# Patient Record
Sex: Female | Born: 1945
Health system: Southern US, Community
[De-identification: ages and names within clinical notes are randomized; demographics above are authoritative.]

## PROBLEM LIST (undated history)

## (undated) DIAGNOSIS — T8859XA Other complications of anesthesia, initial encounter: Secondary | ICD-10-CM

## (undated) DIAGNOSIS — Z9889 Other specified postprocedural states: Secondary | ICD-10-CM

## (undated) DIAGNOSIS — I499 Cardiac arrhythmia, unspecified: Secondary | ICD-10-CM

## (undated) DIAGNOSIS — T4145XA Adverse effect of unspecified anesthetic, initial encounter: Secondary | ICD-10-CM

## (undated) DIAGNOSIS — R112 Nausea with vomiting, unspecified: Secondary | ICD-10-CM

---

## 2001-12-11 HISTORY — PX: FINGER SURGERY: SHX640

## 2005-09-26 ENCOUNTER — Ambulatory Visit: Payer: Self-pay | Admitting: Internal Medicine

## 2006-10-02 ENCOUNTER — Ambulatory Visit: Payer: Self-pay | Admitting: Internal Medicine

## 2006-11-07 ENCOUNTER — Ambulatory Visit: Payer: Self-pay | Admitting: Orthopaedic Surgery

## 2006-11-20 ENCOUNTER — Ambulatory Visit: Payer: Self-pay | Admitting: Orthopaedic Surgery

## 2006-11-20 HISTORY — PX: ROTATOR CUFF REPAIR: SHX139

## 2008-02-26 ENCOUNTER — Ambulatory Visit: Payer: Self-pay | Admitting: Internal Medicine

## 2009-03-03 ENCOUNTER — Ambulatory Visit: Payer: Self-pay | Admitting: Internal Medicine

## 2010-03-07 ENCOUNTER — Ambulatory Visit: Payer: Self-pay | Admitting: Internal Medicine

## 2010-04-15 ENCOUNTER — Ambulatory Visit: Payer: Self-pay | Admitting: Unknown Physician Specialty

## 2011-03-09 ENCOUNTER — Ambulatory Visit: Payer: Self-pay | Admitting: Internal Medicine

## 2011-12-12 HISTORY — PX: KNEE ARTHROSCOPY: SUR90

## 2012-03-18 ENCOUNTER — Ambulatory Visit: Payer: Self-pay | Admitting: Internal Medicine

## 2012-06-19 ENCOUNTER — Other Ambulatory Visit: Payer: Self-pay | Admitting: Rheumatology

## 2012-06-19 LAB — SYNOVIAL CELL COUNT + DIFF, W/ CRYSTALS
Crystals, Joint Fluid: NONE SEEN
Lymphocytes: 47 %
Neutrophils: 23 %
Nucleated Cell Count: 287 /mm3
Other Cells BF: 0 %

## 2012-09-23 ENCOUNTER — Ambulatory Visit: Payer: Self-pay | Admitting: General Practice

## 2012-10-17 ENCOUNTER — Ambulatory Visit: Payer: Self-pay | Admitting: General Practice

## 2012-10-17 DIAGNOSIS — I10 Essential (primary) hypertension: Secondary | ICD-10-CM

## 2012-11-13 ENCOUNTER — Ambulatory Visit: Payer: Self-pay | Admitting: General Practice

## 2013-03-19 ENCOUNTER — Ambulatory Visit: Payer: Self-pay | Admitting: Internal Medicine

## 2014-03-20 ENCOUNTER — Ambulatory Visit: Payer: Self-pay | Admitting: Internal Medicine

## 2015-03-15 DIAGNOSIS — I10 Essential (primary) hypertension: Secondary | ICD-10-CM | POA: Insufficient documentation

## 2015-03-15 DIAGNOSIS — M1990 Unspecified osteoarthritis, unspecified site: Secondary | ICD-10-CM | POA: Insufficient documentation

## 2015-03-23 ENCOUNTER — Ambulatory Visit
Admit: 2015-03-23 | Disposition: A | Discharge status: Home / Self Care | Payer: Self-pay | Attending: Internal Medicine | Admitting: Internal Medicine

## 2015-03-30 NOTE — Op Note (Signed)
PATIENT NAME:  Becky Lam, Becky Lam MR#:  [id number] DATE OF BIRTH:  [birth year]  DATE OF PROCEDURE:  11/13/2012  PREOPERATIVE DIAGNOSIS: Internal derangement of the left knee.   POSTOPERATIVE DIAGNOSES:  1. Tear of the posterior horn medial masses, left knee.  2. Grade 3 chondromalacia involving the medial compartment.   PROCEDURES PERFORMED:  1. Left knee arthroscopy.  2. Partial medial meniscectomy.  3. Chondroplasty of the medial compartment.   SURGEON: Laurice Record. Holley Bouche., MD  ANESTHESIA: General.   ESTIMATED BLOOD LOSS: Minimal.   TOURNIQUET TIME: Not used.   DRAINS: None.   INDICATIONS FOR SURGERY: The patient is a 69 year old female who has been seen for complaints of persistent left knee pain. Patient localized most pain along the medial aspect of the knee and stated the pain was aggravated by pivoting or twisting-type activities. MRI demonstrated findings consistent with meniscal pathology. After discussion of the risks and benefits of surgical intervention, the patient expressed her understanding of the risks and benefits and agreed with plans for surgical intervention.   PROCEDURE IN DETAIL: Patient was brought into the Operating Room and, after adequate general anesthesia was achieved, a tourniquet was placed on the patient's left thigh and leg was placed in a leg holder. All bony prominences were well padded. Patient's left knee and leg were cleaned and prepped with alcohol and DuraPrep, draped in the usual sterile fashion. A "timeout" was performed as per usual protocol. The anticipated portal sites were injected with 0.25% Marcaine with epinephrine. An anterolateral portal was created and a cannula was inserted. A moderate effusion was evacuated. Scope was inserted and the knee was distended with fluid using the Stryker pump. Scope was advanced down the medial gutter into the medial compartment of the knee. Under visualization with the scope, an anteromedial portal was created and  hook probe was inserted. Inspection of the medial compartment demonstrated a complex tear of the posterior horn medial meniscus. There was a component of horizontal cleavage tear as well as flap-type lesions with bases both along the more medial aspect of the medial meniscus as well as along its lateral most attachment. The area of the tear was debrided using a combination of meniscal punches and a 4.5 mm shaver. Final contouring was performed using a 50 degrees ArthroCare wand, with care to carefully sculpt the transition to more meniscus. The posterior horn of the medial meniscus was then probed and felt to be stable. Anterior horn was visualized and probed and felt to be stable. There were changes of grade 3 chondromalacia involving the medial femoral condyle and portion of the medial tibial plateau. These areas were debrided using the 50 degrees ArthroCare wand. Scope was advanced into the intracondylar region and the anterior cruciate ligament was visualized and probed and felt to be stable. Scope was removed from the anterolateral portal and reinserted via the anteromedial portal so as to better visualize the lateral compartment. The articular surface of the lateral compartment was in excellent condition. The lateral meniscus was visualized and probed and felt to be stable. Finally, the scope was positioned so as to visualize the patellofemoral articulation. Good patellar tracking was noted. Articular surface was in good condition.   The knee was irrigated with copious amounts of fluid then suctioned dry. The anterolateral portal was reapproximated using 3-0 nylon. A combination of 0.25% Marcaine with epinephrine and 4 mg of morphine was injected via the scope. The scope was removed and the anteromedial portal was reapproximated using 3-0 nylon. Sterile  dressing was applied followed by application of ice wrap.   Patient tolerated the procedure well. She was transported to the recovery room in stable  condition.    ____________________________ Laurice Record. Holley Bouche., MD jph:cms D: 11/14/2012 02:36:13 ET T: 11/14/2012 10:04:09 ET JOB#: [id number]  cc: Jeneen Rinks P. Holley Bouche., MD, <Dictator>  Laurice Record Holley Bouche MD ELECTRONICALLY SIGNED 11/17/2012 17:36

## 2015-03-31 ENCOUNTER — Ambulatory Visit
Admit: 2015-03-31 | Disposition: A | Discharge status: Home / Self Care | Payer: Self-pay | Attending: Unknown Physician Specialty | Admitting: Unknown Physician Specialty

## 2015-03-31 HISTORY — PX: COLONOSCOPY: SHX174

## 2016-01-12 ENCOUNTER — Encounter: Payer: Self-pay | Admitting: *Deleted

## 2016-01-19 ENCOUNTER — Ambulatory Visit (INDEPENDENT_AMBULATORY_CARE_PROVIDER_SITE_OTHER): Payer: PPO | Admitting: General Surgery

## 2016-01-19 ENCOUNTER — Other Ambulatory Visit: Payer: Self-pay

## 2016-01-19 ENCOUNTER — Encounter: Payer: Self-pay | Admitting: General Surgery

## 2016-01-19 VITALS — BP 132/74 | HR 72 | Resp 14 | Ht 70.0 in | Wt 179.0 lb

## 2016-01-19 DIAGNOSIS — N644 Mastodynia: Secondary | ICD-10-CM | POA: Diagnosis not present

## 2016-01-19 NOTE — Patient Instructions (Signed)
Patient may take aleve for the next two weeks twice daily  Continue self breast exams. Call office for any new breast issues or concerns.  Call office in two weeks to see how things are going.

## 2016-01-19 NOTE — Progress Notes (Signed)
Patient ID: Becky Lam, female   DOB: Dec 17, 1945, 70 y.o.   MRN: [id number]  Chief Complaint  Patient presents with  . Other    left breast pain    HPI Becky Lam is a 70 y.o. female here today for evaluation of left breast pain. She states she has been having left breast pain in her left breast for three weeks now. Pain/burn is always there. The patient has sporadically taken Aleve with relief of her discomfort. There is no history of trauma to the area. No unusual physical activity.  Last mammogram was in April,2016. No family history of breast cancer.  Get regular mammograms but doesn't perform self breast checks.   I personally reviewed the patient's history. HPI  No past medical history on file.  Past Surgical History  Procedure Laterality Date  . Colonoscopy  03/31/15  . Rotator cuff repair Left 11/20/2006  . Finger surgery Right 2003    middle   . Knee arthroscopy Left 2013    Family History  Problem Relation Age of Onset  . Cancer Neg Hx     Social History Social History  Substance Use Topics  . Smoking status: Never Smoker   . Smokeless tobacco: None  . Alcohol Use: No    No Known Allergies  Current Outpatient Prescriptions  Medication Sig Dispense Refill  . aspirin 81 MG tablet Take 81 mg by mouth daily.    . hydrochlorothiazide (HYDRODIURIL) 25 MG tablet TK 1 T PO QD  1  . latanoprost (XALATAN) 0.005 % ophthalmic solution INT 1 GTT IN OU QD  6  . loratadine (ALLERGY) 10 MG tablet Take 10 mg by mouth daily.    . Misc Natural Products (CALCIUM PLUS ADVANCED PO) Take by mouth 2 (two) times daily.    . Misc Natural Products (EQL GREEN TEA COMPLEX) TABS Take by mouth daily.    . Multiple Vitamins-Minerals (MULTIVITAMIN WITH MINERALS) tablet Take 1 tablet by mouth daily.    . Omega-3 Fatty Acids (FISH OIL) 1000 MG CAPS Take by mouth daily.    . phentermine 15 MG capsule TK ONE C PO ONCE D  3  . Vitamin D, Cholecalciferol, 1000 units TABS Take by mouth daily.      No current facility-administered medications for this visit.    Review of Systems Review of Systems  Constitutional: Negative.   Respiratory: Negative.   Cardiovascular: Negative.     Blood pressure 132/74, pulse 72, resp. rate 14, height 5\' 10"  (1.778 m), weight 179 lb (81.194 kg).  Physical Exam Physical Exam  Constitutional: She is oriented to person, place, and time. She appears well-developed and well-nourished.  Eyes: Conjunctivae are normal. No scleral icterus.  Neck: Neck supple.  Cardiovascular: Normal rate, regular rhythm and normal heart sounds.   Pulmonary/Chest: Effort normal and breath sounds normal. Right breast exhibits no inverted nipple, no mass, no nipple discharge, no skin change and no tenderness. Left breast exhibits tenderness (left breast tenderness at 2o'clock). Left breast exhibits no inverted nipple, no mass, no nipple discharge and no skin change.  Abdominal: Soft. Bowel sounds are normal.  Lymphadenopathy:    She has no cervical adenopathy.    She has no axillary adenopathy.  Neurological: She is alert and oriented to person, place, and time.  Skin: Skin is warm and dry.    Data Reviewed [place] mammograms dated 03/23/2015 completed [facility] were reviewed. BI-RADS-1.  Ultrasound examination of the upper-outer quadrant of the left breast was completed.  No discernible abnormality was appreciated within the breast parenchyma. No cyst or solid lesions. No evidence of architectural distortion. A single small normal-appearing lymph node in the lower aspect of the axilla measuring 0.63 x 0.64 x 1.08 cm with normal echo architecture is appreciated. BI-RADS-1.  Assessment    Mild breast tenderness without clear etiology. No clear musculoskeletal source on exam.    Plan    She has been encouraged to make use of Aleve, 2 tablets to start and one tablet twice a day to see if this will resolve the baseline inflammation. This patient was instructed to call the  office with a progress report in 2 weeks.      Patient to return as needed.    PCP:  Sabra Heck,  This information has been scribed by Gaspar Cola CMA.   Robert Bellow 01/20/2016, 7:50 AM

## 2016-01-20 DIAGNOSIS — M2391 Unspecified internal derangement of right knee: Secondary | ICD-10-CM | POA: Diagnosis not present

## 2016-01-20 DIAGNOSIS — M25561 Pain in right knee: Secondary | ICD-10-CM | POA: Diagnosis not present

## 2016-01-20 DIAGNOSIS — N644 Mastodynia: Secondary | ICD-10-CM | POA: Insufficient documentation

## 2016-02-03 DIAGNOSIS — M2391 Unspecified internal derangement of right knee: Secondary | ICD-10-CM | POA: Diagnosis not present

## 2016-02-04 ENCOUNTER — Telehealth: Payer: Self-pay | Admitting: *Deleted

## 2016-02-04 NOTE — Telephone Encounter (Signed)
Patient states in her left breast pain has resolved . Patient tried Aleve, 2 tablets to start and one tablet twice a day and has helped. Patient to follow up as needed. Continue self breast exams. Call office for any new breast issues or concerns.

## 2016-03-28 DIAGNOSIS — Z0000 Encounter for general adult medical examination without abnormal findings: Secondary | ICD-10-CM | POA: Diagnosis not present

## 2016-04-04 ENCOUNTER — Other Ambulatory Visit: Payer: Self-pay | Admitting: Internal Medicine

## 2016-04-04 DIAGNOSIS — Z1231 Encounter for screening mammogram for malignant neoplasm of breast: Secondary | ICD-10-CM

## 2016-04-04 DIAGNOSIS — Z0000 Encounter for general adult medical examination without abnormal findings: Secondary | ICD-10-CM | POA: Diagnosis not present

## 2016-04-18 ENCOUNTER — Other Ambulatory Visit: Payer: Self-pay | Admitting: Internal Medicine

## 2016-04-18 ENCOUNTER — Ambulatory Visit
Admission: RE | Admit: 2016-04-18 | Discharge: 2016-04-18 | Disposition: A | Discharge status: Home / Self Care | Payer: PPO | Source: Ambulatory Visit | Attending: Internal Medicine | Admitting: Internal Medicine

## 2016-04-18 DIAGNOSIS — Z1231 Encounter for screening mammogram for malignant neoplasm of breast: Secondary | ICD-10-CM | POA: Diagnosis not present

## 2016-04-18 DIAGNOSIS — H401131 Primary open-angle glaucoma, bilateral, mild stage: Secondary | ICD-10-CM | POA: Diagnosis not present

## 2016-04-26 DIAGNOSIS — L720 Epidermal cyst: Secondary | ICD-10-CM | POA: Diagnosis not present

## 2016-04-26 DIAGNOSIS — L821 Other seborrheic keratosis: Secondary | ICD-10-CM | POA: Diagnosis not present

## 2016-04-26 DIAGNOSIS — B358 Other dermatophytoses: Secondary | ICD-10-CM | POA: Diagnosis not present

## 2016-04-26 DIAGNOSIS — B351 Tinea unguium: Secondary | ICD-10-CM | POA: Diagnosis not present

## 2016-05-02 DIAGNOSIS — H2513 Age-related nuclear cataract, bilateral: Secondary | ICD-10-CM | POA: Diagnosis not present

## 2016-05-17 DIAGNOSIS — H2513 Age-related nuclear cataract, bilateral: Secondary | ICD-10-CM | POA: Diagnosis not present

## 2016-05-18 ENCOUNTER — Encounter: Payer: Self-pay | Admitting: *Deleted

## 2016-05-23 ENCOUNTER — Encounter: Payer: Self-pay | Admitting: *Deleted

## 2016-05-23 ENCOUNTER — Encounter
Admission: RE | Disposition: A | Discharge status: Home / Self Care | Payer: Self-pay | Source: Ambulatory Visit | Attending: Ophthalmology

## 2016-05-23 ENCOUNTER — Ambulatory Visit: Payer: PPO | Admitting: Anesthesiology

## 2016-05-23 ENCOUNTER — Ambulatory Visit
Admission: RE | Admit: 2016-05-23 | Discharge: 2016-05-23 | Disposition: A | Discharge status: Home / Self Care | Payer: PPO | Source: Ambulatory Visit | Attending: Ophthalmology | Admitting: Ophthalmology

## 2016-05-23 DIAGNOSIS — I499 Cardiac arrhythmia, unspecified: Secondary | ICD-10-CM | POA: Insufficient documentation

## 2016-05-23 DIAGNOSIS — H2513 Age-related nuclear cataract, bilateral: Secondary | ICD-10-CM | POA: Diagnosis not present

## 2016-05-23 DIAGNOSIS — H2512 Age-related nuclear cataract, left eye: Secondary | ICD-10-CM | POA: Insufficient documentation

## 2016-05-23 HISTORY — DX: Cardiac arrhythmia, unspecified: I49.9

## 2016-05-23 HISTORY — PX: CATARACT EXTRACTION W/PHACO: SHX586

## 2016-05-23 HISTORY — DX: Adverse effect of unspecified anesthetic, initial encounter: T41.45XA

## 2016-05-23 HISTORY — DX: Nausea with vomiting, unspecified: R11.2

## 2016-05-23 HISTORY — DX: Other complications of anesthesia, initial encounter: T88.59XA

## 2016-05-23 HISTORY — DX: Nausea with vomiting, unspecified: Z98.890

## 2016-05-23 SURGERY — PHACOEMULSIFICATION, CATARACT, WITH IOL INSERTION
Anesthesia: Monitor Anesthesia Care | Site: Eye | Laterality: Left | Wound class: Clean

## 2016-05-23 MED ORDER — POVIDONE-IODINE 5 % OP SOLN
OPHTHALMIC | Status: AC
  Administered 2016-05-23: 1 via OPHTHALMIC
  Filled 2016-05-23: qty 30

## 2016-05-23 MED ORDER — NA HYALUR & NA CHOND-NA HYALUR 0.4-0.35 ML IO KIT
PACK | INTRAOCULAR | Status: DC | PRN
  Administered 2016-05-23: 1 mL via INTRAOCULAR

## 2016-05-23 MED ORDER — TETRACAINE HCL 0.5 % OP SOLN
OPHTHALMIC | Status: AC
  Administered 2016-05-23: 1 [drp] via OPHTHALMIC
  Filled 2016-05-23: qty 2

## 2016-05-23 MED ORDER — MOXIFLOXACIN HCL 0.5 % OP SOLN
OPHTHALMIC | Status: AC
  Filled 2016-05-23: qty 3

## 2016-05-23 MED ORDER — LIDOCAINE HCL (PF) 4 % IJ SOLN
INTRAOCULAR | Status: DC | PRN
  Administered 2016-05-23: .5 mL via OPHTHALMIC

## 2016-05-23 MED ORDER — CEFUROXIME OPHTHALMIC INJECTION 1 MG/0.1 ML
INJECTION | OPHTHALMIC | Status: DC | PRN
  Administered 2016-05-23: .1 mL via INTRACAMERAL

## 2016-05-23 MED ORDER — SODIUM CHLORIDE 0.9 % IV SOLN
INTRAVENOUS | Status: DC
  Administered 2016-05-23: 08:00:00 via INTRAVENOUS

## 2016-05-23 MED ORDER — LIDOCAINE HCL (PF) 4 % IJ SOLN
INTRAMUSCULAR | Status: AC
  Filled 2016-05-23: qty 5

## 2016-05-23 MED ORDER — EPINEPHRINE HCL 1 MG/ML IJ SOLN
INTRAMUSCULAR | Status: AC
  Filled 2016-05-23: qty 2

## 2016-05-23 MED ORDER — TETRACAINE HCL 0.5 % OP SOLN
1.0000 [drp] | Freq: Once | OPHTHALMIC | Status: AC
  Administered 2016-05-23: 1 [drp] via OPHTHALMIC

## 2016-05-23 MED ORDER — MIDAZOLAM HCL 2 MG/2ML IJ SOLN
INTRAMUSCULAR | Status: DC | PRN
  Administered 2016-05-23: 1 mg via INTRAVENOUS

## 2016-05-23 MED ORDER — ARMC OPHTHALMIC DILATING GEL
OPHTHALMIC | Status: AC
  Administered 2016-05-23: 1 via OPHTHALMIC
  Filled 2016-05-23: qty 0.25

## 2016-05-23 MED ORDER — CARBACHOL 0.01 % IO SOLN
INTRAOCULAR | Status: DC | PRN
  Administered 2016-05-23: .5 mL via INTRAOCULAR

## 2016-05-23 MED ORDER — NA HYALUR & NA CHOND-NA HYALUR 0.55-0.5 ML IO KIT
PACK | INTRAOCULAR | Status: AC
  Filled 2016-05-23: qty 1.05

## 2016-05-23 MED ORDER — EPINEPHRINE HCL 1 MG/ML IJ SOLN
INTRAOCULAR | Status: DC | PRN
  Administered 2016-05-23: 1 mL via OPHTHALMIC

## 2016-05-23 MED ORDER — POVIDONE-IODINE 5 % OP SOLN
1.0000 "application " | Freq: Once | OPHTHALMIC | Status: AC
  Administered 2016-05-23: 1 via OPHTHALMIC

## 2016-05-23 MED ORDER — NEOMYCIN-POLYMYXIN-DEXAMETH 3.5-10000-0.1 OP OINT
TOPICAL_OINTMENT | OPHTHALMIC | Status: AC
  Filled 2016-05-23: qty 3.5

## 2016-05-23 MED ORDER — CEFUROXIME OPHTHALMIC INJECTION 1 MG/0.1 ML
INJECTION | OPHTHALMIC | Status: AC
  Filled 2016-05-23: qty 0.1

## 2016-05-23 MED ORDER — ARMC OPHTHALMIC DILATING GEL
1.0000 | OPHTHALMIC | Status: AC | PRN
Start: 2016-05-23 — End: 2016-05-23
  Administered 2016-05-23 (×2): 1 via OPHTHALMIC

## 2016-05-23 MED ORDER — NEOMYCIN-POLYMYXIN-DEXAMETH 0.1 % OP OINT
TOPICAL_OINTMENT | OPHTHALMIC | Status: DC | PRN
  Administered 2016-05-23: 1 via OPHTHALMIC

## 2016-05-23 SURGICAL SUPPLY — 22 items
CANNULA ANT/CHMB 27G (MISCELLANEOUS) ×1 IMPLANT
CANNULA ANT/CHMB 27GA (MISCELLANEOUS) ×3 IMPLANT
CUP MEDICINE 2OZ PLAST GRAD ST (MISCELLANEOUS) ×3 IMPLANT
GLOVE BIO SURGEON STRL SZ8 (GLOVE) ×3 IMPLANT
GLOVE BIOGEL M 6.5 STRL (GLOVE) ×3 IMPLANT
GLOVE SURG LX 7.5 STRW (GLOVE) ×2
GLOVE SURG LX STRL 7.5 STRW (GLOVE) ×1 IMPLANT
GOWN STRL REUS W/ TWL LRG LVL3 (GOWN DISPOSABLE) ×2 IMPLANT
GOWN STRL REUS W/TWL LRG LVL3 (GOWN DISPOSABLE) ×6
LENS IOL TECNIS ITEC 22.5 (Intraocular Lens) ×2 IMPLANT
PACK CATARACT (MISCELLANEOUS) ×3 IMPLANT
PACK CATARACT BRASINGTON LX (MISCELLANEOUS) ×3 IMPLANT
PACK EYE AFTER SURG (MISCELLANEOUS) ×3 IMPLANT
SOL BSS BAG (MISCELLANEOUS) ×3
SOL PREP PVP 2OZ (MISCELLANEOUS) ×3
SOLUTION BSS BAG (MISCELLANEOUS) ×1 IMPLANT
SOLUTION PREP PVP 2OZ (MISCELLANEOUS) ×1 IMPLANT
SYR 3ML LL SCALE MARK (SYRINGE) ×3 IMPLANT
SYR 5ML LL (SYRINGE) ×3 IMPLANT
SYR TB 1ML 27GX1/2 LL (SYRINGE) ×3 IMPLANT
WATER STERILE IRR 1000ML POUR (IV SOLUTION) ×3 IMPLANT
WIPE NON LINTING 3.25X3.25 (MISCELLANEOUS) ×3 IMPLANT

## 2016-05-23 NOTE — H&P (Signed)
  The History and Physical notes are on paper, have been signed, and are to be scanned. The patient remains stable and unchanged from the H&P.   Previous H&P reviewed, patient examined, and there are no changes.  Shakeena Kafer 05/23/2016 8:46 AM

## 2016-05-23 NOTE — Anesthesia Postprocedure Evaluation (Signed)
Anesthesia Post Note  Patient: Becky Lam  Procedure(s) Performed: Procedure(s) (LRB): CATARACT EXTRACTION PHACO AND INTRAOCULAR LENS PLACEMENT (IOC) (Left)  Patient location during evaluation: PACU Anesthesia Type: MAC Level of consciousness: awake and alert and oriented Pain management: pain level controlled Vital Signs Assessment: post-procedure vital signs reviewed and stable Respiratory status: spontaneous breathing Cardiovascular status: stable Anesthetic complications: no    Last Vitals:  Filed Vitals:   05/23/16 0943 05/23/16 0944  BP: 142/68 142/68  Pulse: 69 68  Temp: 36.6 C 36.6 C  Resp: 18 12    Last Pain: There were no vitals filed for this visit.               Estill Batten

## 2016-05-23 NOTE — Discharge Instructions (Signed)
Eye Surgery Discharge Instructions  Expect mild scratchy sensation or mild soreness. DO NOT RUB YOUR EYE!  The day of surgery:  Minimal physical activity, but bed rest is not required  No reading, computer work, or close hand work  No bending, lifting, or straining.  May watch TV  For 24 hours:  No driving, legal decisions, or alcoholic beverages  Safety precautions  Eat anything you prefer: It is better to start with liquids, then soup then solid foods.  _____ Eye patch should be worn until postoperative exam tomorrow.  ____ Solar shield eyeglasses should be worn for comfort in the sunlight/patch while sleeping  Resume all regular medications including aspirin or Coumadin if these were discontinued prior to surgery. You may shower, bathe, shave, or wash your hair. Tylenol may be taken for mild discomfort.  Call your doctor if you experience significant pain, nausea, or vomiting, fever > 101 or other signs of infection. [phone] or [phone] Specific instructions:  Follow-up Information    Follow up with Leandrew Koyanagi, MD.   Specialty:  Ophthalmology   Why:  June 13 at 3:20pm   Contact information:   [address]   [place] [address] [phone]

## 2016-05-23 NOTE — Anesthesia Preprocedure Evaluation (Signed)
Anesthesia Evaluation  Patient identified by MRN, date of birth, ID band Patient awake    Reviewed: Allergy & Precautions, NPO status , Patient's Chart, lab work & pertinent test results, reviewed documented beta blocker date and time   History of Anesthesia Complications (+) PONV  Airway Mallampati: II  TM Distance: >3 FB     Dental  (+) Chipped   Pulmonary           Cardiovascular + dysrhythmias      Neuro/Psych    GI/Hepatic   Endo/Other    Renal/GU      Musculoskeletal   Abdominal   Peds  Hematology   Anesthesia Other Findings   Reproductive/Obstetrics                             Anesthesia Physical Anesthesia Plan  ASA: II  Anesthesia Plan: MAC   Post-op Pain Management:    Induction:   Airway Management Planned:   Additional Equipment:   Intra-op Plan:   Post-operative Plan:   Informed Consent: I have reviewed the patients History and Physical, chart, labs and discussed the procedure including the risks, benefits and alternatives for the proposed anesthesia with the patient or authorized representative who has indicated his/her understanding and acceptance.     Plan Discussed with: CRNA  Anesthesia Plan Comments:         Anesthesia Quick Evaluation

## 2016-05-23 NOTE — Op Note (Signed)
OPERATIVE NOTE  LYNNANN SARUBBI [id number] 05/23/2016   PREOPERATIVE DIAGNOSIS:  Nuclear sclerotic cataract left eye. H25.12   POSTOPERATIVE DIAGNOSIS:    Nuclear sclerotic cataract left eye.     PROCEDURE:  Phacoemusification with posterior chamber intraocular lens placement of the left eye   LENS:   Implant Name Type Inv. Item Serial No. Manufacturer Lot No. LRB No. Used  LENS IOL DIOP 22.5 - [id number] Intraocular Lens LENS IOL DIOP 22.5 [id number]   Left 1        ULTRASOUND TIME: 17 % of 1 minutes, 13 seconds.  CDE 12.7   SURGEON:  Wyonia Hough, MD   ANESTHESIA:  Topical with tetracaine drops and 2% Xylocaine jelly, augmented with 1% preservative-free intracameral lidocaine.    COMPLICATIONS:  None.   DESCRIPTION OF PROCEDURE:  The patient was identified in the holding room and transported to the operating room and placed in the supine position under the operating microscope.  The left eye was identified as the operative eye and it was prepped and draped in the usual sterile ophthalmic fashion.   A 1 millimeter clear-corneal paracentesis was made at the 1:30 position. 0.5 ml of preservative-free 1% lidocaine was injected into the anterior chamber.  The anterior chamber was filled with Viscoat viscoelastic.  A 2.4 millimeter keratome was used to make a near-clear corneal incision at the 10:30 position.  .  A curvilinear capsulorrhexis was made with a cystotome and capsulorrhexis forceps.  Balanced salt solution was used to hydrodissect and hydrodelineate the nucleus.   Phacoemulsification was then used in stop and chop fashion to remove the lens nucleus and epinucleus.  The remaining cortex was then removed using the irrigation and aspiration handpiece. Provisc was then placed into the capsular bag to distend it for lens placement.  A lens was then injected into the capsular bag.  The remaining viscoelastic was aspirated.   Wounds were hydrated with balanced salt  solution.  The anterior chamber was inflated to a physiologic pressure with balanced salt solution. Cefuroxime 0.1 ml of a 10mg /ml solution was injected into the anterior chamber for a dose of 1 mg of intracameral antibiotic at the completion of the case.  Miostat was placed into the anterior chamber to constrict the pupil.  No wound leaks were noted.  Topical Vigamox drops and Maxitrol ointment were applied to the eye.  The patient was taken to the recovery room in stable condition without complications of anesthesia or surgery  Jamis Kryder 05/23/2016, 9:44 AM

## 2016-05-23 NOTE — Transfer of Care (Signed)
Immediate Anesthesia Transfer of Care Note  Patient: Becky Lam  Procedure(s) Performed: Procedure(s) with comments: CATARACT EXTRACTION PHACO AND INTRAOCULAR LENS PLACEMENT (IOC) (Left) - [place]  01:13 AP% 17.3 CDE 12.65 fluid pack lot # [id number]  Patient Location: PACU  Anesthesia Type:MAC  Level of Consciousness: awake, alert  and oriented  Airway & Oxygen Therapy: Patient Spontanous Breathing  Post-op Assessment: Post -op Vital signs reviewed and stable  Post vital signs: stable  Last Vitals:  Filed Vitals:   05/23/16 0751 05/23/16 0944  BP: 113/69 142/68  Pulse: 78 68  Temp: 36.6 C 36.6 C  Resp: 18 12    Last Pain: There were no vitals filed for this visit.       Complications: No apparent anesthesia complications

## 2016-06-14 ENCOUNTER — Encounter: Payer: Self-pay | Admitting: *Deleted

## 2016-06-16 DIAGNOSIS — H2511 Age-related nuclear cataract, right eye: Secondary | ICD-10-CM | POA: Diagnosis not present

## 2016-06-16 NOTE — Discharge Instructions (Signed)

## 2016-06-21 ENCOUNTER — Ambulatory Visit
Admission: RE | Admit: 2016-06-21 | Discharge: 2016-06-21 | Disposition: A | Discharge status: Home / Self Care | Payer: PPO | Source: Ambulatory Visit | Attending: Ophthalmology | Admitting: Ophthalmology

## 2016-06-21 ENCOUNTER — Ambulatory Visit: Payer: PPO | Admitting: Anesthesiology

## 2016-06-21 ENCOUNTER — Encounter: Payer: Self-pay | Admitting: *Deleted

## 2016-06-21 ENCOUNTER — Encounter
Admission: RE | Disposition: A | Discharge status: Home / Self Care | Payer: Self-pay | Source: Ambulatory Visit | Attending: Ophthalmology

## 2016-06-21 DIAGNOSIS — H2511 Age-related nuclear cataract, right eye: Secondary | ICD-10-CM | POA: Diagnosis not present

## 2016-06-21 HISTORY — PX: CATARACT EXTRACTION W/PHACO: SHX586

## 2016-06-21 SURGERY — PHACOEMULSIFICATION, CATARACT, WITH IOL INSERTION
Anesthesia: Monitor Anesthesia Care | Site: Eye | Laterality: Right

## 2016-06-21 MED ORDER — ARMC OPHTHALMIC DILATING GEL
1.0000 "application " | OPHTHALMIC | Status: DC | PRN
  Administered 2016-06-21 (×2): 1 via OPHTHALMIC

## 2016-06-21 MED ORDER — ACETAMINOPHEN 325 MG PO TABS
325.0000 mg | ORAL_TABLET | ORAL | Status: DC | PRN
Start: 2016-06-21 — End: 2016-06-21

## 2016-06-21 MED ORDER — LACTATED RINGERS IV SOLN: INTRAVENOUS | Status: DC

## 2016-06-21 MED ORDER — BRIMONIDINE TARTRATE 0.2 % OP SOLN
OPHTHALMIC | Status: DC | PRN
  Administered 2016-06-21: 1 [drp] via OPHTHALMIC

## 2016-06-21 MED ORDER — TIMOLOL MALEATE 0.5 % OP SOLN
OPHTHALMIC | Status: DC | PRN
  Administered 2016-06-21: 1 [drp] via OPHTHALMIC

## 2016-06-21 MED ORDER — EPINEPHRINE HCL 1 MG/ML IJ SOLN
INTRAOCULAR | Status: DC | PRN
  Administered 2016-06-21: 73 mL via OPHTHALMIC

## 2016-06-21 MED ORDER — TETRACAINE HCL 0.5 % OP SOLN
1.0000 [drp] | OPHTHALMIC | Status: DC | PRN
  Administered 2016-06-21: 1 [drp] via OPHTHALMIC

## 2016-06-21 MED ORDER — CEFUROXIME OPHTHALMIC INJECTION 1 MG/0.1 ML
INJECTION | OPHTHALMIC | Status: DC | PRN
  Administered 2016-06-21: 0.1 mL via INTRACAMERAL

## 2016-06-21 MED ORDER — LIDOCAINE HCL (PF) 4 % IJ SOLN
INTRAOCULAR | Status: DC | PRN
  Administered 2016-06-21: 1 mL via OPHTHALMIC

## 2016-06-21 MED ORDER — NA HYALUR & NA CHOND-NA HYALUR 0.4-0.35 ML IO KIT
PACK | INTRAOCULAR | Status: DC | PRN
  Administered 2016-06-21: 1 mL via INTRAOCULAR

## 2016-06-21 MED ORDER — FENTANYL CITRATE (PF) 100 MCG/2ML IJ SOLN
INTRAMUSCULAR | Status: DC | PRN
  Administered 2016-06-21: 100 ug via INTRAVENOUS

## 2016-06-21 MED ORDER — POVIDONE-IODINE 5 % OP SOLN
1.0000 "application " | OPHTHALMIC | Status: DC | PRN
  Administered 2016-06-21: 1 via OPHTHALMIC

## 2016-06-21 MED ORDER — ACETAMINOPHEN 160 MG/5ML PO SOLN: 325.0000 mg | ORAL | Status: DC | PRN

## 2016-06-21 MED ORDER — MIDAZOLAM HCL 2 MG/2ML IJ SOLN
INTRAMUSCULAR | Status: DC | PRN
  Administered 2016-06-21: 2 mg via INTRAVENOUS

## 2016-06-21 SURGICAL SUPPLY — 27 items
CANNULA ANT/CHMB 27G (MISCELLANEOUS) ×1 IMPLANT
CANNULA ANT/CHMB 27GA (MISCELLANEOUS) ×3 IMPLANT
CARTRIDGE ABBOTT (MISCELLANEOUS) IMPLANT
GLOVE SURG LX 7.5 STRW (GLOVE) ×2
GLOVE SURG LX STRL 7.5 STRW (GLOVE) ×1 IMPLANT
GLOVE SURG TRIUMPH 8.0 PF LTX (GLOVE) ×3 IMPLANT
GOWN STRL REUS W/ TWL LRG LVL3 (GOWN DISPOSABLE) ×2 IMPLANT
GOWN STRL REUS W/TWL LRG LVL3 (GOWN DISPOSABLE) ×6
LENS IOL TECNIS ITEC 22.5 (Intraocular Lens) ×2 IMPLANT
MARKER SKIN DUAL TIP RULER LAB (MISCELLANEOUS) ×3 IMPLANT
NDL FILTER BLUNT 18X1 1/2 (NEEDLE) ×1 IMPLANT
NDL RETROBULBAR .5 NSTRL (NEEDLE) IMPLANT
NEEDLE FILTER BLUNT 18X 1/2SAF (NEEDLE) ×2
NEEDLE FILTER BLUNT 18X1 1/2 (NEEDLE) ×1 IMPLANT
PACK CATARACT BRASINGTON (MISCELLANEOUS) ×3 IMPLANT
PACK EYE AFTER SURG (MISCELLANEOUS) ×3 IMPLANT
PACK OPTHALMIC (MISCELLANEOUS) ×3 IMPLANT
RING MALYGIN 7.0 (MISCELLANEOUS) IMPLANT
SUT ETHILON 10-0 CS-B-6CS-B-6 (SUTURE)
SUT VICRYL  9 0 (SUTURE)
SUT VICRYL 9 0 (SUTURE) IMPLANT
SUTURE EHLN 10-0 CS-B-6CS-B-6 (SUTURE) IMPLANT
SYR 3ML LL SCALE MARK (SYRINGE) ×3 IMPLANT
SYR 5ML LL (SYRINGE) ×3 IMPLANT
SYR TB 1ML LUER SLIP (SYRINGE) ×3 IMPLANT
WATER STERILE IRR 250ML POUR (IV SOLUTION) ×3 IMPLANT
WIPE NON LINTING 3.25X3.25 (MISCELLANEOUS) ×3 IMPLANT

## 2016-06-21 NOTE — Anesthesia Preprocedure Evaluation (Signed)
Anesthesia Evaluation  Patient identified by MRN, date of birth, ID band  Reviewed: Allergy & Precautions, H&P , NPO status , Patient's Chart, lab work & pertinent test results  Airway Mallampati: II  TM Distance: >3 FB Neck ROM: full    Dental no notable dental hx.    Pulmonary    Pulmonary exam normal       Cardiovascular Rhythm:regular Rate:Normal     Neuro/Psych    GI/Hepatic   Endo/Other    Renal/GU      Musculoskeletal   Abdominal   Peds  Hematology   Anesthesia Other Findings   Reproductive/Obstetrics                             Anesthesia Physical Anesthesia Plan  ASA: II  Anesthesia Plan: MAC   Post-op Pain Management:    Induction:   Airway Management Planned:   Additional Equipment:   Intra-op Plan:   Post-operative Plan:   Informed Consent: I have reviewed the patients History and Physical, chart, labs and discussed the procedure including the risks, benefits and alternatives for the proposed anesthesia with the patient or authorized representative who has indicated his/her understanding and acceptance.     Plan Discussed with: CRNA  Anesthesia Plan Comments:         Anesthesia Quick Evaluation  

## 2016-06-21 NOTE — Anesthesia Procedure Notes (Signed)
Procedure Name: MAC Performed by: Florita Nitsch Pre-anesthesia Checklist: Patient identified, Emergency Drugs available, Suction available, Timeout performed and Patient being monitored Patient Re-evaluated:Patient Re-evaluated prior to inductionOxygen Delivery Method: Nasal cannula Placement Confirmation: positive ETCO2     

## 2016-06-21 NOTE — Anesthesia Postprocedure Evaluation (Signed)
Anesthesia Post Note  Patient: Becky Lam  Procedure(s) Performed: Procedure(s) (LRB): CATARACT EXTRACTION PHACO AND INTRAOCULAR LENS PLACEMENT (IOC) right eye (Right)  Patient location during evaluation: PACU Anesthesia Type: MAC Level of consciousness: awake and alert and oriented Pain management: satisfactory to patient Vital Signs Assessment: post-procedure vital signs reviewed and stable Respiratory status: spontaneous breathing, nonlabored ventilation and respiratory function stable Cardiovascular status: blood pressure returned to baseline and stable Postop Assessment: Adequate PO intake and No signs of nausea or vomiting Anesthetic complications: no    Raliegh Ip

## 2016-06-21 NOTE — Transfer of Care (Signed)
Immediate Anesthesia Transfer of Care Note  Patient: Becky Lam  Procedure(s) Performed: Procedure(s): CATARACT EXTRACTION PHACO AND INTRAOCULAR LENS PLACEMENT (IOC) right eye (Right)  Patient Location: PACU  Anesthesia Type: MAC  Level of Consciousness: awake, alert  and patient cooperative  Airway and Oxygen Therapy: Patient Spontanous Breathing and Patient connected to supplemental oxygen  Post-op Assessment: Post-op Vital signs reviewed, Patient's Cardiovascular Status Stable, Respiratory Function Stable, Patent Airway and No signs of Nausea or vomiting  Post-op Vital Signs: Reviewed and stable  Complications: No apparent anesthesia complications

## 2016-06-21 NOTE — H&P (Signed)
  The History and Physical notes are on paper, have been signed, and are to be scanned. The patient remains stable and unchanged from the H&P.   Previous H&P reviewed, patient examined, and there are no changes.  Becky Lam 06/21/2016 10:48 AM

## 2016-06-21 NOTE — Op Note (Signed)
LOCATION:  [facility]   PREOPERATIVE DIAGNOSIS:    Nuclear sclerotic cataract right eye. H25.11   POSTOPERATIVE DIAGNOSIS:  Nuclear sclerotic cataract right eye.     PROCEDURE:  Phacoemusification with posterior chamber intraocular lens placement of the right eye   LENS:   Implant Name Type Inv. Item Serial No. Manufacturer Lot No. LRB No. Used  LENS IOL DIOP 22.5 - [id number] Intraocular Lens LENS IOL DIOP 22.5 [id number]   Right 1        ULTRASOUND TIME: 18 % of 1 minutes, 6 seconds.  CDE 11.9   SURGEON:  Wyonia Hough, MD   ANESTHESIA:  Topical with tetracaine drops and 2% Xylocaine jelly, augmented with 1% preservative-free intracameral lidocaine.    COMPLICATIONS:  None.   DESCRIPTION OF PROCEDURE:  The patient was identified in the holding room and transported to the operating room and placed in the supine position under the operating microscope.  The right eye was identified as the operative eye and it was prepped and draped in the usual sterile ophthalmic fashion.   A 1 millimeter clear-corneal paracentesis was made at the 12:00 position.  0.5 ml of preservative-free 1% lidocaine was injected into the anterior chamber. The anterior chamber was filled with Viscoat viscoelastic.  A 2.4 millimeter keratome was used to make a near-clear corneal incision at the 9:00 position.  A curvilinear capsulorrhexis was made with a cystotome and capsulorrhexis forceps.  Balanced salt solution was used to hydrodissect and hydrodelineate the nucleus.   Phacoemulsification was then used in stop and chop fashion to remove the lens nucleus and epinucleus.  The remaining cortex was then removed using the irrigation and aspiration handpiece. Provisc was then placed into the capsular bag to distend it for lens placement.  A lens was then injected into the capsular bag.  The remaining viscoelastic was aspirated.   Wounds were hydrated with balanced salt solution.  The anterior  chamber was inflated to a physiologic pressure with balanced salt solution.  No wound leaks were noted. Cefuroxime 0.1 ml of a 10mg /ml solution was injected into the anterior chamber for a dose of 1 mg of intracameral antibiotic at the completion of the case.   Timolol and Brimonidine drops were applied to the eye.  The patient was taken to the recovery room in stable condition without complications of anesthesia or surgery.   Chane Cowden 06/21/2016, 11:36 AM

## 2016-06-22 ENCOUNTER — Encounter: Payer: Self-pay | Admitting: Ophthalmology

## 2016-07-28 DIAGNOSIS — Z961 Presence of intraocular lens: Secondary | ICD-10-CM | POA: Diagnosis not present

## 2016-09-14 DIAGNOSIS — L821 Other seborrheic keratosis: Secondary | ICD-10-CM | POA: Diagnosis not present

## 2016-09-14 DIAGNOSIS — D1801 Hemangioma of skin and subcutaneous tissue: Secondary | ICD-10-CM | POA: Diagnosis not present

## 2016-10-04 DIAGNOSIS — Z23 Encounter for immunization: Secondary | ICD-10-CM | POA: Diagnosis not present

## 2016-10-24 DIAGNOSIS — M1990 Unspecified osteoarthritis, unspecified site: Secondary | ICD-10-CM | POA: Diagnosis not present

## 2016-12-05 DIAGNOSIS — M7552 Bursitis of left shoulder: Secondary | ICD-10-CM | POA: Diagnosis not present

## 2016-12-25 DIAGNOSIS — M25512 Pain in left shoulder: Secondary | ICD-10-CM | POA: Diagnosis not present

## 2016-12-25 DIAGNOSIS — M899 Disorder of bone, unspecified: Secondary | ICD-10-CM | POA: Diagnosis not present

## 2016-12-25 DIAGNOSIS — M19012 Primary osteoarthritis, left shoulder: Secondary | ICD-10-CM | POA: Diagnosis not present

## 2016-12-27 DIAGNOSIS — M6281 Muscle weakness (generalized): Secondary | ICD-10-CM | POA: Diagnosis not present

## 2016-12-27 DIAGNOSIS — M7542 Impingement syndrome of left shoulder: Secondary | ICD-10-CM | POA: Diagnosis not present

## 2016-12-27 DIAGNOSIS — M25512 Pain in left shoulder: Secondary | ICD-10-CM | POA: Diagnosis not present

## 2016-12-27 DIAGNOSIS — M546 Pain in thoracic spine: Secondary | ICD-10-CM | POA: Diagnosis not present

## 2017-01-01 DIAGNOSIS — M6281 Muscle weakness (generalized): Secondary | ICD-10-CM | POA: Diagnosis not present

## 2017-01-01 DIAGNOSIS — M7542 Impingement syndrome of left shoulder: Secondary | ICD-10-CM | POA: Diagnosis not present

## 2017-01-01 DIAGNOSIS — M25512 Pain in left shoulder: Secondary | ICD-10-CM | POA: Diagnosis not present

## 2017-01-01 DIAGNOSIS — M546 Pain in thoracic spine: Secondary | ICD-10-CM | POA: Diagnosis not present

## 2017-01-03 DIAGNOSIS — M25512 Pain in left shoulder: Secondary | ICD-10-CM | POA: Diagnosis not present

## 2017-01-03 DIAGNOSIS — M6281 Muscle weakness (generalized): Secondary | ICD-10-CM | POA: Diagnosis not present

## 2017-01-03 DIAGNOSIS — M546 Pain in thoracic spine: Secondary | ICD-10-CM | POA: Diagnosis not present

## 2017-01-03 DIAGNOSIS — M7542 Impingement syndrome of left shoulder: Secondary | ICD-10-CM | POA: Diagnosis not present

## 2017-01-08 DIAGNOSIS — M25512 Pain in left shoulder: Secondary | ICD-10-CM | POA: Diagnosis not present

## 2017-01-08 DIAGNOSIS — M546 Pain in thoracic spine: Secondary | ICD-10-CM | POA: Diagnosis not present

## 2017-01-08 DIAGNOSIS — M7542 Impingement syndrome of left shoulder: Secondary | ICD-10-CM | POA: Diagnosis not present

## 2017-01-08 DIAGNOSIS — M6281 Muscle weakness (generalized): Secondary | ICD-10-CM | POA: Diagnosis not present

## 2017-01-10 DIAGNOSIS — M546 Pain in thoracic spine: Secondary | ICD-10-CM | POA: Diagnosis not present

## 2017-01-10 DIAGNOSIS — M25512 Pain in left shoulder: Secondary | ICD-10-CM | POA: Diagnosis not present

## 2017-01-10 DIAGNOSIS — M7542 Impingement syndrome of left shoulder: Secondary | ICD-10-CM | POA: Diagnosis not present

## 2017-01-10 DIAGNOSIS — M6281 Muscle weakness (generalized): Secondary | ICD-10-CM | POA: Diagnosis not present

## 2017-01-17 DIAGNOSIS — M25512 Pain in left shoulder: Secondary | ICD-10-CM | POA: Diagnosis not present

## 2017-01-17 DIAGNOSIS — M546 Pain in thoracic spine: Secondary | ICD-10-CM | POA: Diagnosis not present

## 2017-01-17 DIAGNOSIS — M7542 Impingement syndrome of left shoulder: Secondary | ICD-10-CM | POA: Diagnosis not present

## 2017-01-17 DIAGNOSIS — M6281 Muscle weakness (generalized): Secondary | ICD-10-CM | POA: Diagnosis not present

## 2017-01-22 ENCOUNTER — Other Ambulatory Visit: Payer: Self-pay | Admitting: Student

## 2017-01-22 DIAGNOSIS — M5412 Radiculopathy, cervical region: Secondary | ICD-10-CM

## 2017-01-22 DIAGNOSIS — M542 Cervicalgia: Secondary | ICD-10-CM | POA: Diagnosis not present

## 2017-01-22 DIAGNOSIS — M7542 Impingement syndrome of left shoulder: Secondary | ICD-10-CM | POA: Diagnosis not present

## 2017-01-22 DIAGNOSIS — M6281 Muscle weakness (generalized): Secondary | ICD-10-CM | POA: Diagnosis not present

## 2017-01-22 DIAGNOSIS — M546 Pain in thoracic spine: Secondary | ICD-10-CM | POA: Diagnosis not present

## 2017-01-22 DIAGNOSIS — M899 Disorder of bone, unspecified: Secondary | ICD-10-CM | POA: Diagnosis not present

## 2017-01-22 DIAGNOSIS — M4722 Other spondylosis with radiculopathy, cervical region: Secondary | ICD-10-CM

## 2017-01-22 DIAGNOSIS — M19012 Primary osteoarthritis, left shoulder: Secondary | ICD-10-CM | POA: Diagnosis not present

## 2017-01-22 DIAGNOSIS — M25512 Pain in left shoulder: Secondary | ICD-10-CM | POA: Diagnosis not present

## 2017-01-25 ENCOUNTER — Other Ambulatory Visit: Payer: Self-pay | Admitting: Student

## 2017-01-25 DIAGNOSIS — M19012 Primary osteoarthritis, left shoulder: Secondary | ICD-10-CM

## 2017-01-25 DIAGNOSIS — M899 Disorder of bone, unspecified: Secondary | ICD-10-CM

## 2017-01-26 DIAGNOSIS — H401131 Primary open-angle glaucoma, bilateral, mild stage: Secondary | ICD-10-CM | POA: Diagnosis not present

## 2017-02-02 DIAGNOSIS — H401131 Primary open-angle glaucoma, bilateral, mild stage: Secondary | ICD-10-CM | POA: Diagnosis not present

## 2017-02-08 ENCOUNTER — Ambulatory Visit
Admission: RE | Admit: 2017-02-08 | Discharge: 2017-02-08 | Disposition: A | Discharge status: Home / Self Care | Payer: PPO | Source: Ambulatory Visit | Attending: Student | Admitting: Student

## 2017-02-08 DIAGNOSIS — M5031 Other cervical disc degeneration,  high cervical region: Secondary | ICD-10-CM | POA: Diagnosis not present

## 2017-02-08 DIAGNOSIS — M19012 Primary osteoarthritis, left shoulder: Secondary | ICD-10-CM | POA: Diagnosis not present

## 2017-02-08 DIAGNOSIS — M899 Disorder of bone, unspecified: Secondary | ICD-10-CM | POA: Diagnosis not present

## 2017-02-08 DIAGNOSIS — M50321 Other cervical disc degeneration at C4-C5 level: Secondary | ICD-10-CM | POA: Diagnosis not present

## 2017-02-08 DIAGNOSIS — M4722 Other spondylosis with radiculopathy, cervical region: Secondary | ICD-10-CM | POA: Insufficient documentation

## 2017-02-08 DIAGNOSIS — M5412 Radiculopathy, cervical region: Secondary | ICD-10-CM

## 2017-02-08 DIAGNOSIS — M75102 Unspecified rotator cuff tear or rupture of left shoulder, not specified as traumatic: Secondary | ICD-10-CM | POA: Diagnosis not present

## 2017-03-05 DIAGNOSIS — M531 Cervicobrachial syndrome: Secondary | ICD-10-CM | POA: Diagnosis not present

## 2017-03-05 DIAGNOSIS — M9901 Segmental and somatic dysfunction of cervical region: Secondary | ICD-10-CM | POA: Diagnosis not present

## 2017-03-05 DIAGNOSIS — M545 Low back pain: Secondary | ICD-10-CM | POA: Diagnosis not present

## 2017-03-06 DIAGNOSIS — M9901 Segmental and somatic dysfunction of cervical region: Secondary | ICD-10-CM | POA: Diagnosis not present

## 2017-03-06 DIAGNOSIS — M531 Cervicobrachial syndrome: Secondary | ICD-10-CM | POA: Diagnosis not present

## 2017-03-06 DIAGNOSIS — M545 Low back pain: Secondary | ICD-10-CM | POA: Diagnosis not present

## 2017-03-07 DIAGNOSIS — M545 Low back pain: Secondary | ICD-10-CM | POA: Diagnosis not present

## 2017-03-07 DIAGNOSIS — M531 Cervicobrachial syndrome: Secondary | ICD-10-CM | POA: Diagnosis not present

## 2017-03-07 DIAGNOSIS — M9901 Segmental and somatic dysfunction of cervical region: Secondary | ICD-10-CM | POA: Diagnosis not present

## 2017-03-13 DIAGNOSIS — M9901 Segmental and somatic dysfunction of cervical region: Secondary | ICD-10-CM | POA: Diagnosis not present

## 2017-03-13 DIAGNOSIS — M531 Cervicobrachial syndrome: Secondary | ICD-10-CM | POA: Diagnosis not present

## 2017-03-13 DIAGNOSIS — M545 Low back pain: Secondary | ICD-10-CM | POA: Diagnosis not present

## 2017-03-16 DIAGNOSIS — R600 Localized edema: Secondary | ICD-10-CM | POA: Diagnosis not present

## 2017-03-20 DIAGNOSIS — M531 Cervicobrachial syndrome: Secondary | ICD-10-CM | POA: Diagnosis not present

## 2017-03-20 DIAGNOSIS — M545 Low back pain: Secondary | ICD-10-CM | POA: Diagnosis not present

## 2017-03-20 DIAGNOSIS — M9901 Segmental and somatic dysfunction of cervical region: Secondary | ICD-10-CM | POA: Diagnosis not present

## 2017-03-21 DIAGNOSIS — M5030 Other cervical disc degeneration, unspecified cervical region: Secondary | ICD-10-CM | POA: Diagnosis not present

## 2017-03-21 DIAGNOSIS — M5412 Radiculopathy, cervical region: Secondary | ICD-10-CM | POA: Diagnosis not present

## 2017-03-29 DIAGNOSIS — Z79899 Other long term (current) drug therapy: Secondary | ICD-10-CM | POA: Diagnosis not present

## 2017-03-29 DIAGNOSIS — Z0000 Encounter for general adult medical examination without abnormal findings: Secondary | ICD-10-CM | POA: Diagnosis not present

## 2017-04-02 DIAGNOSIS — D500 Iron deficiency anemia secondary to blood loss (chronic): Secondary | ICD-10-CM | POA: Diagnosis not present

## 2017-04-03 DIAGNOSIS — M531 Cervicobrachial syndrome: Secondary | ICD-10-CM | POA: Diagnosis not present

## 2017-04-03 DIAGNOSIS — M9901 Segmental and somatic dysfunction of cervical region: Secondary | ICD-10-CM | POA: Diagnosis not present

## 2017-04-03 DIAGNOSIS — M545 Low back pain: Secondary | ICD-10-CM | POA: Diagnosis not present

## 2017-04-05 ENCOUNTER — Other Ambulatory Visit: Payer: Self-pay | Admitting: Internal Medicine

## 2017-04-05 DIAGNOSIS — Z0000 Encounter for general adult medical examination without abnormal findings: Secondary | ICD-10-CM | POA: Diagnosis not present

## 2017-04-05 DIAGNOSIS — D508 Other iron deficiency anemias: Secondary | ICD-10-CM | POA: Diagnosis not present

## 2017-04-05 DIAGNOSIS — Z1231 Encounter for screening mammogram for malignant neoplasm of breast: Secondary | ICD-10-CM

## 2017-04-16 DIAGNOSIS — M5412 Radiculopathy, cervical region: Secondary | ICD-10-CM | POA: Diagnosis not present

## 2017-04-16 DIAGNOSIS — M4722 Other spondylosis with radiculopathy, cervical region: Secondary | ICD-10-CM | POA: Diagnosis not present

## 2017-04-18 DIAGNOSIS — D508 Other iron deficiency anemias: Secondary | ICD-10-CM | POA: Diagnosis not present

## 2017-04-24 ENCOUNTER — Ambulatory Visit: Admission: RE | Admit: 2017-04-24 | Payer: PPO | Source: Ambulatory Visit

## 2017-04-25 DIAGNOSIS — D508 Other iron deficiency anemias: Secondary | ICD-10-CM | POA: Diagnosis not present

## 2017-05-03 DIAGNOSIS — D508 Other iron deficiency anemias: Secondary | ICD-10-CM | POA: Diagnosis not present

## 2017-05-16 ENCOUNTER — Ambulatory Visit
Admission: RE | Admit: 2017-05-16 | Discharge: 2017-05-16 | Disposition: A | Discharge status: Home / Self Care | Payer: PPO | Source: Ambulatory Visit | Attending: Internal Medicine | Admitting: Internal Medicine

## 2017-05-16 DIAGNOSIS — Z1231 Encounter for screening mammogram for malignant neoplasm of breast: Secondary | ICD-10-CM | POA: Diagnosis not present

## 2017-05-22 DIAGNOSIS — R21 Rash and other nonspecific skin eruption: Secondary | ICD-10-CM | POA: Diagnosis not present

## 2017-08-06 DIAGNOSIS — D508 Other iron deficiency anemias: Secondary | ICD-10-CM | POA: Diagnosis not present

## 2017-08-07 DIAGNOSIS — H40003 Preglaucoma, unspecified, bilateral: Secondary | ICD-10-CM | POA: Diagnosis not present

## 2017-08-24 DIAGNOSIS — L821 Other seborrheic keratosis: Secondary | ICD-10-CM | POA: Diagnosis not present

## 2017-08-24 DIAGNOSIS — L570 Actinic keratosis: Secondary | ICD-10-CM | POA: Diagnosis not present

## 2017-08-24 DIAGNOSIS — X32XXXA Exposure to sunlight, initial encounter: Secondary | ICD-10-CM | POA: Diagnosis not present

## 2017-08-30 DIAGNOSIS — S90222A Contusion of left lesser toe(s) with damage to nail, initial encounter: Secondary | ICD-10-CM | POA: Diagnosis not present

## 2017-09-12 DIAGNOSIS — D225 Melanocytic nevi of trunk: Secondary | ICD-10-CM | POA: Diagnosis not present

## 2017-09-12 DIAGNOSIS — D2261 Melanocytic nevi of right upper limb, including shoulder: Secondary | ICD-10-CM | POA: Diagnosis not present

## 2017-09-12 DIAGNOSIS — D2262 Melanocytic nevi of left upper limb, including shoulder: Secondary | ICD-10-CM | POA: Diagnosis not present

## 2017-10-29 DIAGNOSIS — D508 Other iron deficiency anemias: Secondary | ICD-10-CM | POA: Diagnosis not present

## 2017-11-05 DIAGNOSIS — Z79899 Other long term (current) drug therapy: Secondary | ICD-10-CM | POA: Diagnosis not present

## 2017-11-05 DIAGNOSIS — D508 Other iron deficiency anemias: Secondary | ICD-10-CM | POA: Diagnosis not present

## 2017-12-12 DIAGNOSIS — M9904 Segmental and somatic dysfunction of sacral region: Secondary | ICD-10-CM | POA: Diagnosis not present

## 2017-12-12 DIAGNOSIS — M5414 Radiculopathy, thoracic region: Secondary | ICD-10-CM | POA: Diagnosis not present

## 2017-12-12 DIAGNOSIS — M9903 Segmental and somatic dysfunction of lumbar region: Secondary | ICD-10-CM | POA: Diagnosis not present

## 2017-12-12 DIAGNOSIS — M461 Sacroiliitis, not elsewhere classified: Secondary | ICD-10-CM | POA: Diagnosis not present

## 2017-12-14 DIAGNOSIS — M5414 Radiculopathy, thoracic region: Secondary | ICD-10-CM | POA: Diagnosis not present

## 2017-12-14 DIAGNOSIS — M461 Sacroiliitis, not elsewhere classified: Secondary | ICD-10-CM | POA: Diagnosis not present

## 2017-12-14 DIAGNOSIS — M9903 Segmental and somatic dysfunction of lumbar region: Secondary | ICD-10-CM | POA: Diagnosis not present

## 2017-12-14 DIAGNOSIS — M9904 Segmental and somatic dysfunction of sacral region: Secondary | ICD-10-CM | POA: Diagnosis not present

## 2017-12-17 DIAGNOSIS — M9904 Segmental and somatic dysfunction of sacral region: Secondary | ICD-10-CM | POA: Diagnosis not present

## 2017-12-17 DIAGNOSIS — M9903 Segmental and somatic dysfunction of lumbar region: Secondary | ICD-10-CM | POA: Diagnosis not present

## 2017-12-17 DIAGNOSIS — M5414 Radiculopathy, thoracic region: Secondary | ICD-10-CM | POA: Diagnosis not present

## 2017-12-17 DIAGNOSIS — M461 Sacroiliitis, not elsewhere classified: Secondary | ICD-10-CM | POA: Diagnosis not present

## 2017-12-20 DIAGNOSIS — M5414 Radiculopathy, thoracic region: Secondary | ICD-10-CM | POA: Diagnosis not present

## 2017-12-20 DIAGNOSIS — M9904 Segmental and somatic dysfunction of sacral region: Secondary | ICD-10-CM | POA: Diagnosis not present

## 2017-12-20 DIAGNOSIS — M461 Sacroiliitis, not elsewhere classified: Secondary | ICD-10-CM | POA: Diagnosis not present

## 2017-12-20 DIAGNOSIS — M9903 Segmental and somatic dysfunction of lumbar region: Secondary | ICD-10-CM | POA: Diagnosis not present

## 2018-02-05 DIAGNOSIS — H40003 Preglaucoma, unspecified, bilateral: Secondary | ICD-10-CM | POA: Diagnosis not present

## 2018-02-12 DIAGNOSIS — H40053 Ocular hypertension, bilateral: Secondary | ICD-10-CM | POA: Diagnosis not present

## 2018-04-08 DIAGNOSIS — M9903 Segmental and somatic dysfunction of lumbar region: Secondary | ICD-10-CM | POA: Diagnosis not present

## 2018-04-08 DIAGNOSIS — M9904 Segmental and somatic dysfunction of sacral region: Secondary | ICD-10-CM | POA: Diagnosis not present

## 2018-04-08 DIAGNOSIS — M461 Sacroiliitis, not elsewhere classified: Secondary | ICD-10-CM | POA: Diagnosis not present

## 2018-04-08 DIAGNOSIS — M545 Low back pain: Secondary | ICD-10-CM | POA: Diagnosis not present

## 2018-04-30 DIAGNOSIS — D508 Other iron deficiency anemias: Secondary | ICD-10-CM | POA: Diagnosis not present

## 2018-04-30 DIAGNOSIS — Z79899 Other long term (current) drug therapy: Secondary | ICD-10-CM | POA: Diagnosis not present

## 2018-05-07 DIAGNOSIS — Z0000 Encounter for general adult medical examination without abnormal findings: Secondary | ICD-10-CM | POA: Diagnosis not present

## 2018-05-07 DIAGNOSIS — Z79899 Other long term (current) drug therapy: Secondary | ICD-10-CM | POA: Diagnosis not present

## 2018-05-07 DIAGNOSIS — D508 Other iron deficiency anemias: Secondary | ICD-10-CM | POA: Diagnosis not present

## 2018-05-10 ENCOUNTER — Other Ambulatory Visit: Payer: Self-pay | Admitting: Internal Medicine

## 2018-05-10 DIAGNOSIS — Z1231 Encounter for screening mammogram for malignant neoplasm of breast: Secondary | ICD-10-CM

## 2018-05-21 ENCOUNTER — Ambulatory Visit
Admission: RE | Admit: 2018-05-21 | Discharge: 2018-05-21 | Disposition: A | Discharge status: Home / Self Care | Payer: PPO | Source: Ambulatory Visit | Attending: Internal Medicine | Admitting: Internal Medicine

## 2018-05-21 DIAGNOSIS — Z1231 Encounter for screening mammogram for malignant neoplasm of breast: Secondary | ICD-10-CM | POA: Insufficient documentation

## 2018-08-07 DIAGNOSIS — B019 Varicella without complication: Secondary | ICD-10-CM | POA: Diagnosis not present

## 2018-08-07 DIAGNOSIS — B029 Zoster without complications: Secondary | ICD-10-CM | POA: Diagnosis not present

## 2018-08-13 DIAGNOSIS — H40003 Preglaucoma, unspecified, bilateral: Secondary | ICD-10-CM | POA: Diagnosis not present

## 2018-10-29 DIAGNOSIS — Z79899 Other long term (current) drug therapy: Secondary | ICD-10-CM | POA: Diagnosis not present

## 2018-10-29 DIAGNOSIS — D508 Other iron deficiency anemias: Secondary | ICD-10-CM | POA: Diagnosis not present

## 2018-11-05 DIAGNOSIS — Z79899 Other long term (current) drug therapy: Secondary | ICD-10-CM | POA: Diagnosis not present

## 2018-11-05 DIAGNOSIS — D500 Iron deficiency anemia secondary to blood loss (chronic): Secondary | ICD-10-CM | POA: Diagnosis not present

## 2018-11-05 DIAGNOSIS — E538 Deficiency of other specified B group vitamins: Secondary | ICD-10-CM | POA: Diagnosis not present

## 2018-12-18 DIAGNOSIS — D1801 Hemangioma of skin and subcutaneous tissue: Secondary | ICD-10-CM | POA: Diagnosis not present

## 2018-12-18 DIAGNOSIS — X32XXXA Exposure to sunlight, initial encounter: Secondary | ICD-10-CM | POA: Diagnosis not present

## 2018-12-18 DIAGNOSIS — L814 Other melanin hyperpigmentation: Secondary | ICD-10-CM | POA: Diagnosis not present

## 2019-01-22 DIAGNOSIS — M7062 Trochanteric bursitis, left hip: Secondary | ICD-10-CM | POA: Diagnosis not present

## 2019-01-22 DIAGNOSIS — M25552 Pain in left hip: Secondary | ICD-10-CM | POA: Diagnosis not present

## 2019-01-30 DIAGNOSIS — M9903 Segmental and somatic dysfunction of lumbar region: Secondary | ICD-10-CM | POA: Diagnosis not present

## 2019-01-30 DIAGNOSIS — M9905 Segmental and somatic dysfunction of pelvic region: Secondary | ICD-10-CM | POA: Diagnosis not present

## 2019-01-30 DIAGNOSIS — M5416 Radiculopathy, lumbar region: Secondary | ICD-10-CM | POA: Diagnosis not present

## 2019-01-30 DIAGNOSIS — M5432 Sciatica, left side: Secondary | ICD-10-CM | POA: Diagnosis not present

## 2019-02-03 DIAGNOSIS — M5416 Radiculopathy, lumbar region: Secondary | ICD-10-CM | POA: Diagnosis not present

## 2019-02-03 DIAGNOSIS — M9905 Segmental and somatic dysfunction of pelvic region: Secondary | ICD-10-CM | POA: Diagnosis not present

## 2019-02-03 DIAGNOSIS — M9903 Segmental and somatic dysfunction of lumbar region: Secondary | ICD-10-CM | POA: Diagnosis not present

## 2019-02-03 DIAGNOSIS — M5432 Sciatica, left side: Secondary | ICD-10-CM | POA: Diagnosis not present

## 2019-02-04 DIAGNOSIS — D500 Iron deficiency anemia secondary to blood loss (chronic): Secondary | ICD-10-CM | POA: Diagnosis not present

## 2019-02-04 DIAGNOSIS — E538 Deficiency of other specified B group vitamins: Secondary | ICD-10-CM | POA: Diagnosis not present

## 2019-02-05 DIAGNOSIS — M5416 Radiculopathy, lumbar region: Secondary | ICD-10-CM | POA: Diagnosis not present

## 2019-02-05 DIAGNOSIS — M9905 Segmental and somatic dysfunction of pelvic region: Secondary | ICD-10-CM | POA: Diagnosis not present

## 2019-02-05 DIAGNOSIS — M9903 Segmental and somatic dysfunction of lumbar region: Secondary | ICD-10-CM | POA: Diagnosis not present

## 2019-02-05 DIAGNOSIS — M5432 Sciatica, left side: Secondary | ICD-10-CM | POA: Diagnosis not present

## 2019-02-07 DIAGNOSIS — M5432 Sciatica, left side: Secondary | ICD-10-CM | POA: Diagnosis not present

## 2019-02-07 DIAGNOSIS — M5416 Radiculopathy, lumbar region: Secondary | ICD-10-CM | POA: Diagnosis not present

## 2019-02-07 DIAGNOSIS — M9905 Segmental and somatic dysfunction of pelvic region: Secondary | ICD-10-CM | POA: Diagnosis not present

## 2019-02-07 DIAGNOSIS — M9903 Segmental and somatic dysfunction of lumbar region: Secondary | ICD-10-CM | POA: Diagnosis not present

## 2019-02-10 DIAGNOSIS — M5432 Sciatica, left side: Secondary | ICD-10-CM | POA: Diagnosis not present

## 2019-02-10 DIAGNOSIS — M5416 Radiculopathy, lumbar region: Secondary | ICD-10-CM | POA: Diagnosis not present

## 2019-02-10 DIAGNOSIS — M9905 Segmental and somatic dysfunction of pelvic region: Secondary | ICD-10-CM | POA: Diagnosis not present

## 2019-02-10 DIAGNOSIS — H40003 Preglaucoma, unspecified, bilateral: Secondary | ICD-10-CM | POA: Diagnosis not present

## 2019-02-10 DIAGNOSIS — M9903 Segmental and somatic dysfunction of lumbar region: Secondary | ICD-10-CM | POA: Diagnosis not present

## 2019-02-12 DIAGNOSIS — M5432 Sciatica, left side: Secondary | ICD-10-CM | POA: Diagnosis not present

## 2019-02-12 DIAGNOSIS — M5416 Radiculopathy, lumbar region: Secondary | ICD-10-CM | POA: Diagnosis not present

## 2019-02-12 DIAGNOSIS — M9903 Segmental and somatic dysfunction of lumbar region: Secondary | ICD-10-CM | POA: Diagnosis not present

## 2019-02-12 DIAGNOSIS — M9905 Segmental and somatic dysfunction of pelvic region: Secondary | ICD-10-CM | POA: Diagnosis not present

## 2019-02-14 DIAGNOSIS — M5432 Sciatica, left side: Secondary | ICD-10-CM | POA: Diagnosis not present

## 2019-02-14 DIAGNOSIS — M9905 Segmental and somatic dysfunction of pelvic region: Secondary | ICD-10-CM | POA: Diagnosis not present

## 2019-02-14 DIAGNOSIS — M5416 Radiculopathy, lumbar region: Secondary | ICD-10-CM | POA: Diagnosis not present

## 2019-02-14 DIAGNOSIS — M9903 Segmental and somatic dysfunction of lumbar region: Secondary | ICD-10-CM | POA: Diagnosis not present

## 2019-02-17 DIAGNOSIS — M5416 Radiculopathy, lumbar region: Secondary | ICD-10-CM | POA: Diagnosis not present

## 2019-02-17 DIAGNOSIS — H40003 Preglaucoma, unspecified, bilateral: Secondary | ICD-10-CM | POA: Diagnosis not present

## 2019-02-17 DIAGNOSIS — M9905 Segmental and somatic dysfunction of pelvic region: Secondary | ICD-10-CM | POA: Diagnosis not present

## 2019-02-17 DIAGNOSIS — M9903 Segmental and somatic dysfunction of lumbar region: Secondary | ICD-10-CM | POA: Diagnosis not present

## 2019-02-17 DIAGNOSIS — M5432 Sciatica, left side: Secondary | ICD-10-CM | POA: Diagnosis not present

## 2019-02-19 DIAGNOSIS — M5432 Sciatica, left side: Secondary | ICD-10-CM | POA: Diagnosis not present

## 2019-02-19 DIAGNOSIS — M5416 Radiculopathy, lumbar region: Secondary | ICD-10-CM | POA: Diagnosis not present

## 2019-02-19 DIAGNOSIS — M9905 Segmental and somatic dysfunction of pelvic region: Secondary | ICD-10-CM | POA: Diagnosis not present

## 2019-02-19 DIAGNOSIS — M9903 Segmental and somatic dysfunction of lumbar region: Secondary | ICD-10-CM | POA: Diagnosis not present

## 2019-02-21 DIAGNOSIS — M5432 Sciatica, left side: Secondary | ICD-10-CM | POA: Diagnosis not present

## 2019-02-21 DIAGNOSIS — M5416 Radiculopathy, lumbar region: Secondary | ICD-10-CM | POA: Diagnosis not present

## 2019-02-21 DIAGNOSIS — M9903 Segmental and somatic dysfunction of lumbar region: Secondary | ICD-10-CM | POA: Diagnosis not present

## 2019-02-21 DIAGNOSIS — M9905 Segmental and somatic dysfunction of pelvic region: Secondary | ICD-10-CM | POA: Diagnosis not present

## 2019-02-24 DIAGNOSIS — M9905 Segmental and somatic dysfunction of pelvic region: Secondary | ICD-10-CM | POA: Diagnosis not present

## 2019-02-24 DIAGNOSIS — M5416 Radiculopathy, lumbar region: Secondary | ICD-10-CM | POA: Diagnosis not present

## 2019-02-24 DIAGNOSIS — M9903 Segmental and somatic dysfunction of lumbar region: Secondary | ICD-10-CM | POA: Diagnosis not present

## 2019-02-24 DIAGNOSIS — M5432 Sciatica, left side: Secondary | ICD-10-CM | POA: Diagnosis not present

## 2019-02-26 DIAGNOSIS — M5416 Radiculopathy, lumbar region: Secondary | ICD-10-CM | POA: Diagnosis not present

## 2019-02-26 DIAGNOSIS — M9903 Segmental and somatic dysfunction of lumbar region: Secondary | ICD-10-CM | POA: Diagnosis not present

## 2019-02-26 DIAGNOSIS — M5432 Sciatica, left side: Secondary | ICD-10-CM | POA: Diagnosis not present

## 2019-02-26 DIAGNOSIS — M9905 Segmental and somatic dysfunction of pelvic region: Secondary | ICD-10-CM | POA: Diagnosis not present

## 2019-03-03 DIAGNOSIS — M9905 Segmental and somatic dysfunction of pelvic region: Secondary | ICD-10-CM | POA: Diagnosis not present

## 2019-03-03 DIAGNOSIS — M5432 Sciatica, left side: Secondary | ICD-10-CM | POA: Diagnosis not present

## 2019-03-03 DIAGNOSIS — M5416 Radiculopathy, lumbar region: Secondary | ICD-10-CM | POA: Diagnosis not present

## 2019-03-03 DIAGNOSIS — M9903 Segmental and somatic dysfunction of lumbar region: Secondary | ICD-10-CM | POA: Diagnosis not present

## 2019-03-05 DIAGNOSIS — M9905 Segmental and somatic dysfunction of pelvic region: Secondary | ICD-10-CM | POA: Diagnosis not present

## 2019-03-05 DIAGNOSIS — M5432 Sciatica, left side: Secondary | ICD-10-CM | POA: Diagnosis not present

## 2019-03-05 DIAGNOSIS — M5416 Radiculopathy, lumbar region: Secondary | ICD-10-CM | POA: Diagnosis not present

## 2019-03-05 DIAGNOSIS — M9903 Segmental and somatic dysfunction of lumbar region: Secondary | ICD-10-CM | POA: Diagnosis not present

## 2019-03-10 DIAGNOSIS — M9903 Segmental and somatic dysfunction of lumbar region: Secondary | ICD-10-CM | POA: Diagnosis not present

## 2019-03-10 DIAGNOSIS — M5416 Radiculopathy, lumbar region: Secondary | ICD-10-CM | POA: Diagnosis not present

## 2019-03-10 DIAGNOSIS — M9905 Segmental and somatic dysfunction of pelvic region: Secondary | ICD-10-CM | POA: Diagnosis not present

## 2019-03-10 DIAGNOSIS — M5432 Sciatica, left side: Secondary | ICD-10-CM | POA: Diagnosis not present

## 2019-03-13 DIAGNOSIS — M5432 Sciatica, left side: Secondary | ICD-10-CM | POA: Diagnosis not present

## 2019-03-13 DIAGNOSIS — M5416 Radiculopathy, lumbar region: Secondary | ICD-10-CM | POA: Diagnosis not present

## 2019-03-13 DIAGNOSIS — M9905 Segmental and somatic dysfunction of pelvic region: Secondary | ICD-10-CM | POA: Diagnosis not present

## 2019-03-13 DIAGNOSIS — M9903 Segmental and somatic dysfunction of lumbar region: Secondary | ICD-10-CM | POA: Diagnosis not present

## 2019-03-19 DIAGNOSIS — M9903 Segmental and somatic dysfunction of lumbar region: Secondary | ICD-10-CM | POA: Diagnosis not present

## 2019-03-19 DIAGNOSIS — M5416 Radiculopathy, lumbar region: Secondary | ICD-10-CM | POA: Diagnosis not present

## 2019-03-19 DIAGNOSIS — M5432 Sciatica, left side: Secondary | ICD-10-CM | POA: Diagnosis not present

## 2019-03-19 DIAGNOSIS — M9905 Segmental and somatic dysfunction of pelvic region: Secondary | ICD-10-CM | POA: Diagnosis not present

## 2019-03-25 DIAGNOSIS — M5416 Radiculopathy, lumbar region: Secondary | ICD-10-CM | POA: Diagnosis not present

## 2019-03-25 DIAGNOSIS — M5432 Sciatica, left side: Secondary | ICD-10-CM | POA: Diagnosis not present

## 2019-03-25 DIAGNOSIS — M9903 Segmental and somatic dysfunction of lumbar region: Secondary | ICD-10-CM | POA: Diagnosis not present

## 2019-03-25 DIAGNOSIS — M9905 Segmental and somatic dysfunction of pelvic region: Secondary | ICD-10-CM | POA: Diagnosis not present

## 2019-04-01 DIAGNOSIS — M9905 Segmental and somatic dysfunction of pelvic region: Secondary | ICD-10-CM | POA: Diagnosis not present

## 2019-04-01 DIAGNOSIS — M5432 Sciatica, left side: Secondary | ICD-10-CM | POA: Diagnosis not present

## 2019-04-01 DIAGNOSIS — M5416 Radiculopathy, lumbar region: Secondary | ICD-10-CM | POA: Diagnosis not present

## 2019-04-01 DIAGNOSIS — M9903 Segmental and somatic dysfunction of lumbar region: Secondary | ICD-10-CM | POA: Diagnosis not present

## 2019-04-01 DIAGNOSIS — M7062 Trochanteric bursitis, left hip: Secondary | ICD-10-CM | POA: Diagnosis not present

## 2019-04-15 DIAGNOSIS — M9905 Segmental and somatic dysfunction of pelvic region: Secondary | ICD-10-CM | POA: Diagnosis not present

## 2019-04-15 DIAGNOSIS — M5416 Radiculopathy, lumbar region: Secondary | ICD-10-CM | POA: Diagnosis not present

## 2019-04-15 DIAGNOSIS — M5432 Sciatica, left side: Secondary | ICD-10-CM | POA: Diagnosis not present

## 2019-04-15 DIAGNOSIS — M9903 Segmental and somatic dysfunction of lumbar region: Secondary | ICD-10-CM | POA: Diagnosis not present

## 2019-04-24 ENCOUNTER — Other Ambulatory Visit: Payer: Self-pay | Admitting: Internal Medicine

## 2019-04-24 DIAGNOSIS — Z1231 Encounter for screening mammogram for malignant neoplasm of breast: Secondary | ICD-10-CM

## 2019-04-25 DIAGNOSIS — M25571 Pain in right ankle and joints of right foot: Secondary | ICD-10-CM | POA: Diagnosis not present

## 2019-04-25 DIAGNOSIS — M19079 Primary osteoarthritis, unspecified ankle and foot: Secondary | ICD-10-CM | POA: Diagnosis not present

## 2019-04-29 DIAGNOSIS — M25871 Other specified joint disorders, right ankle and foot: Secondary | ICD-10-CM | POA: Diagnosis not present

## 2019-04-29 DIAGNOSIS — M65871 Other synovitis and tenosynovitis, right ankle and foot: Secondary | ICD-10-CM | POA: Diagnosis not present

## 2019-05-09 DIAGNOSIS — Z79899 Other long term (current) drug therapy: Secondary | ICD-10-CM | POA: Diagnosis not present

## 2019-05-15 DIAGNOSIS — M9905 Segmental and somatic dysfunction of pelvic region: Secondary | ICD-10-CM | POA: Diagnosis not present

## 2019-05-15 DIAGNOSIS — M5432 Sciatica, left side: Secondary | ICD-10-CM | POA: Diagnosis not present

## 2019-05-15 DIAGNOSIS — M9903 Segmental and somatic dysfunction of lumbar region: Secondary | ICD-10-CM | POA: Diagnosis not present

## 2019-05-15 DIAGNOSIS — M5416 Radiculopathy, lumbar region: Secondary | ICD-10-CM | POA: Diagnosis not present

## 2019-05-16 DIAGNOSIS — D500 Iron deficiency anemia secondary to blood loss (chronic): Secondary | ICD-10-CM | POA: Diagnosis not present

## 2019-05-16 DIAGNOSIS — Z0000 Encounter for general adult medical examination without abnormal findings: Secondary | ICD-10-CM | POA: Diagnosis not present

## 2019-05-16 DIAGNOSIS — E538 Deficiency of other specified B group vitamins: Secondary | ICD-10-CM | POA: Diagnosis not present

## 2019-05-23 ENCOUNTER — Other Ambulatory Visit: Payer: Self-pay

## 2019-05-23 ENCOUNTER — Ambulatory Visit
Admission: RE | Admit: 2019-05-23 | Discharge: 2019-05-23 | Disposition: A | Discharge status: Home / Self Care | Payer: PPO | Source: Ambulatory Visit | Attending: Internal Medicine | Admitting: Internal Medicine

## 2019-05-23 DIAGNOSIS — Z1231 Encounter for screening mammogram for malignant neoplasm of breast: Secondary | ICD-10-CM | POA: Diagnosis not present

## 2019-05-26 DIAGNOSIS — Z1211 Encounter for screening for malignant neoplasm of colon: Secondary | ICD-10-CM | POA: Diagnosis not present

## 2019-05-26 DIAGNOSIS — Z1212 Encounter for screening for malignant neoplasm of rectum: Secondary | ICD-10-CM | POA: Diagnosis not present

## 2019-06-17 DIAGNOSIS — M9903 Segmental and somatic dysfunction of lumbar region: Secondary | ICD-10-CM | POA: Diagnosis not present

## 2019-06-17 DIAGNOSIS — M9905 Segmental and somatic dysfunction of pelvic region: Secondary | ICD-10-CM | POA: Diagnosis not present

## 2019-06-17 DIAGNOSIS — M5432 Sciatica, left side: Secondary | ICD-10-CM | POA: Diagnosis not present

## 2019-06-17 DIAGNOSIS — M5416 Radiculopathy, lumbar region: Secondary | ICD-10-CM | POA: Diagnosis not present

## 2019-06-18 DIAGNOSIS — M5432 Sciatica, left side: Secondary | ICD-10-CM | POA: Diagnosis not present

## 2019-06-18 DIAGNOSIS — M9905 Segmental and somatic dysfunction of pelvic region: Secondary | ICD-10-CM | POA: Diagnosis not present

## 2019-06-18 DIAGNOSIS — M9903 Segmental and somatic dysfunction of lumbar region: Secondary | ICD-10-CM | POA: Diagnosis not present

## 2019-06-18 DIAGNOSIS — M5416 Radiculopathy, lumbar region: Secondary | ICD-10-CM | POA: Diagnosis not present

## 2019-06-20 DIAGNOSIS — M5432 Sciatica, left side: Secondary | ICD-10-CM | POA: Diagnosis not present

## 2019-06-20 DIAGNOSIS — M9905 Segmental and somatic dysfunction of pelvic region: Secondary | ICD-10-CM | POA: Diagnosis not present

## 2019-06-20 DIAGNOSIS — L255 Unspecified contact dermatitis due to plants, except food: Secondary | ICD-10-CM | POA: Diagnosis not present

## 2019-06-20 DIAGNOSIS — M5416 Radiculopathy, lumbar region: Secondary | ICD-10-CM | POA: Diagnosis not present

## 2019-06-20 DIAGNOSIS — L03113 Cellulitis of right upper limb: Secondary | ICD-10-CM | POA: Diagnosis not present

## 2019-06-20 DIAGNOSIS — M9903 Segmental and somatic dysfunction of lumbar region: Secondary | ICD-10-CM | POA: Diagnosis not present

## 2019-06-20 DIAGNOSIS — Z23 Encounter for immunization: Secondary | ICD-10-CM | POA: Diagnosis not present

## 2019-06-23 DIAGNOSIS — M9905 Segmental and somatic dysfunction of pelvic region: Secondary | ICD-10-CM | POA: Diagnosis not present

## 2019-06-23 DIAGNOSIS — M9903 Segmental and somatic dysfunction of lumbar region: Secondary | ICD-10-CM | POA: Diagnosis not present

## 2019-06-23 DIAGNOSIS — M5432 Sciatica, left side: Secondary | ICD-10-CM | POA: Diagnosis not present

## 2019-06-23 DIAGNOSIS — M5416 Radiculopathy, lumbar region: Secondary | ICD-10-CM | POA: Diagnosis not present

## 2019-06-25 DIAGNOSIS — M5416 Radiculopathy, lumbar region: Secondary | ICD-10-CM | POA: Diagnosis not present

## 2019-06-25 DIAGNOSIS — M9903 Segmental and somatic dysfunction of lumbar region: Secondary | ICD-10-CM | POA: Diagnosis not present

## 2019-06-25 DIAGNOSIS — M9905 Segmental and somatic dysfunction of pelvic region: Secondary | ICD-10-CM | POA: Diagnosis not present

## 2019-06-25 DIAGNOSIS — M5432 Sciatica, left side: Secondary | ICD-10-CM | POA: Diagnosis not present

## 2019-06-26 DIAGNOSIS — M9905 Segmental and somatic dysfunction of pelvic region: Secondary | ICD-10-CM | POA: Diagnosis not present

## 2019-06-26 DIAGNOSIS — M5432 Sciatica, left side: Secondary | ICD-10-CM | POA: Diagnosis not present

## 2019-06-26 DIAGNOSIS — M5416 Radiculopathy, lumbar region: Secondary | ICD-10-CM | POA: Diagnosis not present

## 2019-06-26 DIAGNOSIS — M9903 Segmental and somatic dysfunction of lumbar region: Secondary | ICD-10-CM | POA: Diagnosis not present

## 2019-06-27 DIAGNOSIS — M5432 Sciatica, left side: Secondary | ICD-10-CM | POA: Diagnosis not present

## 2019-06-27 DIAGNOSIS — M9905 Segmental and somatic dysfunction of pelvic region: Secondary | ICD-10-CM | POA: Diagnosis not present

## 2019-06-27 DIAGNOSIS — M5416 Radiculopathy, lumbar region: Secondary | ICD-10-CM | POA: Diagnosis not present

## 2019-06-27 DIAGNOSIS — M9903 Segmental and somatic dysfunction of lumbar region: Secondary | ICD-10-CM | POA: Diagnosis not present

## 2019-06-30 DIAGNOSIS — M5416 Radiculopathy, lumbar region: Secondary | ICD-10-CM | POA: Diagnosis not present

## 2019-06-30 DIAGNOSIS — M9905 Segmental and somatic dysfunction of pelvic region: Secondary | ICD-10-CM | POA: Diagnosis not present

## 2019-06-30 DIAGNOSIS — M9903 Segmental and somatic dysfunction of lumbar region: Secondary | ICD-10-CM | POA: Diagnosis not present

## 2019-06-30 DIAGNOSIS — M5432 Sciatica, left side: Secondary | ICD-10-CM | POA: Diagnosis not present

## 2019-07-02 DIAGNOSIS — M47812 Spondylosis without myelopathy or radiculopathy, cervical region: Secondary | ICD-10-CM | POA: Diagnosis not present

## 2019-07-02 DIAGNOSIS — M9903 Segmental and somatic dysfunction of lumbar region: Secondary | ICD-10-CM | POA: Diagnosis not present

## 2019-07-02 DIAGNOSIS — M5416 Radiculopathy, lumbar region: Secondary | ICD-10-CM | POA: Diagnosis not present

## 2019-07-02 DIAGNOSIS — M5412 Radiculopathy, cervical region: Secondary | ICD-10-CM | POA: Diagnosis not present

## 2019-07-02 DIAGNOSIS — M5432 Sciatica, left side: Secondary | ICD-10-CM | POA: Diagnosis not present

## 2019-07-02 DIAGNOSIS — M25561 Pain in right knee: Secondary | ICD-10-CM | POA: Diagnosis not present

## 2019-07-02 DIAGNOSIS — M25512 Pain in left shoulder: Secondary | ICD-10-CM | POA: Diagnosis not present

## 2019-07-02 DIAGNOSIS — M9905 Segmental and somatic dysfunction of pelvic region: Secondary | ICD-10-CM | POA: Diagnosis not present

## 2019-07-04 DIAGNOSIS — M9905 Segmental and somatic dysfunction of pelvic region: Secondary | ICD-10-CM | POA: Diagnosis not present

## 2019-07-04 DIAGNOSIS — M5416 Radiculopathy, lumbar region: Secondary | ICD-10-CM | POA: Diagnosis not present

## 2019-07-04 DIAGNOSIS — M5432 Sciatica, left side: Secondary | ICD-10-CM | POA: Diagnosis not present

## 2019-07-04 DIAGNOSIS — M9903 Segmental and somatic dysfunction of lumbar region: Secondary | ICD-10-CM | POA: Diagnosis not present

## 2019-07-07 DIAGNOSIS — M5416 Radiculopathy, lumbar region: Secondary | ICD-10-CM | POA: Diagnosis not present

## 2019-07-07 DIAGNOSIS — M5432 Sciatica, left side: Secondary | ICD-10-CM | POA: Diagnosis not present

## 2019-07-07 DIAGNOSIS — M9905 Segmental and somatic dysfunction of pelvic region: Secondary | ICD-10-CM | POA: Diagnosis not present

## 2019-07-07 DIAGNOSIS — M9903 Segmental and somatic dysfunction of lumbar region: Secondary | ICD-10-CM | POA: Diagnosis not present

## 2019-07-09 DIAGNOSIS — M9905 Segmental and somatic dysfunction of pelvic region: Secondary | ICD-10-CM | POA: Diagnosis not present

## 2019-07-09 DIAGNOSIS — M9903 Segmental and somatic dysfunction of lumbar region: Secondary | ICD-10-CM | POA: Diagnosis not present

## 2019-07-09 DIAGNOSIS — M5432 Sciatica, left side: Secondary | ICD-10-CM | POA: Diagnosis not present

## 2019-07-09 DIAGNOSIS — M5416 Radiculopathy, lumbar region: Secondary | ICD-10-CM | POA: Diagnosis not present

## 2019-07-11 DIAGNOSIS — M9905 Segmental and somatic dysfunction of pelvic region: Secondary | ICD-10-CM | POA: Diagnosis not present

## 2019-07-11 DIAGNOSIS — M5432 Sciatica, left side: Secondary | ICD-10-CM | POA: Diagnosis not present

## 2019-07-11 DIAGNOSIS — M9903 Segmental and somatic dysfunction of lumbar region: Secondary | ICD-10-CM | POA: Diagnosis not present

## 2019-07-11 DIAGNOSIS — M5416 Radiculopathy, lumbar region: Secondary | ICD-10-CM | POA: Diagnosis not present

## 2019-07-14 DIAGNOSIS — M5416 Radiculopathy, lumbar region: Secondary | ICD-10-CM | POA: Diagnosis not present

## 2019-07-14 DIAGNOSIS — M9905 Segmental and somatic dysfunction of pelvic region: Secondary | ICD-10-CM | POA: Diagnosis not present

## 2019-07-14 DIAGNOSIS — M9903 Segmental and somatic dysfunction of lumbar region: Secondary | ICD-10-CM | POA: Diagnosis not present

## 2019-07-14 DIAGNOSIS — M5432 Sciatica, left side: Secondary | ICD-10-CM | POA: Diagnosis not present

## 2019-07-16 DIAGNOSIS — M9903 Segmental and somatic dysfunction of lumbar region: Secondary | ICD-10-CM | POA: Diagnosis not present

## 2019-07-16 DIAGNOSIS — M9905 Segmental and somatic dysfunction of pelvic region: Secondary | ICD-10-CM | POA: Diagnosis not present

## 2019-07-16 DIAGNOSIS — M5416 Radiculopathy, lumbar region: Secondary | ICD-10-CM | POA: Diagnosis not present

## 2019-07-16 DIAGNOSIS — M5432 Sciatica, left side: Secondary | ICD-10-CM | POA: Diagnosis not present

## 2019-07-21 DIAGNOSIS — M5416 Radiculopathy, lumbar region: Secondary | ICD-10-CM | POA: Diagnosis not present

## 2019-07-21 DIAGNOSIS — M5432 Sciatica, left side: Secondary | ICD-10-CM | POA: Diagnosis not present

## 2019-07-21 DIAGNOSIS — M9903 Segmental and somatic dysfunction of lumbar region: Secondary | ICD-10-CM | POA: Diagnosis not present

## 2019-07-21 DIAGNOSIS — M9905 Segmental and somatic dysfunction of pelvic region: Secondary | ICD-10-CM | POA: Diagnosis not present

## 2019-07-23 DIAGNOSIS — M9903 Segmental and somatic dysfunction of lumbar region: Secondary | ICD-10-CM | POA: Diagnosis not present

## 2019-07-23 DIAGNOSIS — M9905 Segmental and somatic dysfunction of pelvic region: Secondary | ICD-10-CM | POA: Diagnosis not present

## 2019-07-23 DIAGNOSIS — M5432 Sciatica, left side: Secondary | ICD-10-CM | POA: Diagnosis not present

## 2019-07-23 DIAGNOSIS — M5416 Radiculopathy, lumbar region: Secondary | ICD-10-CM | POA: Diagnosis not present

## 2019-07-25 DIAGNOSIS — M5416 Radiculopathy, lumbar region: Secondary | ICD-10-CM | POA: Diagnosis not present

## 2019-07-25 DIAGNOSIS — M9903 Segmental and somatic dysfunction of lumbar region: Secondary | ICD-10-CM | POA: Diagnosis not present

## 2019-07-25 DIAGNOSIS — M5432 Sciatica, left side: Secondary | ICD-10-CM | POA: Diagnosis not present

## 2019-07-25 DIAGNOSIS — M9905 Segmental and somatic dysfunction of pelvic region: Secondary | ICD-10-CM | POA: Diagnosis not present

## 2019-07-29 DIAGNOSIS — M5416 Radiculopathy, lumbar region: Secondary | ICD-10-CM | POA: Diagnosis not present

## 2019-07-29 DIAGNOSIS — M5432 Sciatica, left side: Secondary | ICD-10-CM | POA: Diagnosis not present

## 2019-07-29 DIAGNOSIS — M9905 Segmental and somatic dysfunction of pelvic region: Secondary | ICD-10-CM | POA: Diagnosis not present

## 2019-07-29 DIAGNOSIS — M9903 Segmental and somatic dysfunction of lumbar region: Secondary | ICD-10-CM | POA: Diagnosis not present

## 2019-08-01 DIAGNOSIS — M9905 Segmental and somatic dysfunction of pelvic region: Secondary | ICD-10-CM | POA: Diagnosis not present

## 2019-08-01 DIAGNOSIS — M5416 Radiculopathy, lumbar region: Secondary | ICD-10-CM | POA: Diagnosis not present

## 2019-08-01 DIAGNOSIS — M5432 Sciatica, left side: Secondary | ICD-10-CM | POA: Diagnosis not present

## 2019-08-01 DIAGNOSIS — M9903 Segmental and somatic dysfunction of lumbar region: Secondary | ICD-10-CM | POA: Diagnosis not present

## 2019-08-04 DIAGNOSIS — M9905 Segmental and somatic dysfunction of pelvic region: Secondary | ICD-10-CM | POA: Diagnosis not present

## 2019-08-04 DIAGNOSIS — M5416 Radiculopathy, lumbar region: Secondary | ICD-10-CM | POA: Diagnosis not present

## 2019-08-04 DIAGNOSIS — M9903 Segmental and somatic dysfunction of lumbar region: Secondary | ICD-10-CM | POA: Diagnosis not present

## 2019-08-04 DIAGNOSIS — M5432 Sciatica, left side: Secondary | ICD-10-CM | POA: Diagnosis not present

## 2019-08-08 DIAGNOSIS — M9905 Segmental and somatic dysfunction of pelvic region: Secondary | ICD-10-CM | POA: Diagnosis not present

## 2019-08-08 DIAGNOSIS — M5416 Radiculopathy, lumbar region: Secondary | ICD-10-CM | POA: Diagnosis not present

## 2019-08-08 DIAGNOSIS — M5432 Sciatica, left side: Secondary | ICD-10-CM | POA: Diagnosis not present

## 2019-08-08 DIAGNOSIS — M9903 Segmental and somatic dysfunction of lumbar region: Secondary | ICD-10-CM | POA: Diagnosis not present

## 2019-08-11 DIAGNOSIS — M9903 Segmental and somatic dysfunction of lumbar region: Secondary | ICD-10-CM | POA: Diagnosis not present

## 2019-08-11 DIAGNOSIS — M9905 Segmental and somatic dysfunction of pelvic region: Secondary | ICD-10-CM | POA: Diagnosis not present

## 2019-08-11 DIAGNOSIS — M5432 Sciatica, left side: Secondary | ICD-10-CM | POA: Diagnosis not present

## 2019-08-11 DIAGNOSIS — M5416 Radiculopathy, lumbar region: Secondary | ICD-10-CM | POA: Diagnosis not present

## 2019-08-15 DIAGNOSIS — M5432 Sciatica, left side: Secondary | ICD-10-CM | POA: Diagnosis not present

## 2019-08-15 DIAGNOSIS — M9903 Segmental and somatic dysfunction of lumbar region: Secondary | ICD-10-CM | POA: Diagnosis not present

## 2019-08-15 DIAGNOSIS — M5416 Radiculopathy, lumbar region: Secondary | ICD-10-CM | POA: Diagnosis not present

## 2019-08-15 DIAGNOSIS — M9905 Segmental and somatic dysfunction of pelvic region: Secondary | ICD-10-CM | POA: Diagnosis not present

## 2019-08-19 DIAGNOSIS — H40003 Preglaucoma, unspecified, bilateral: Secondary | ICD-10-CM | POA: Diagnosis not present

## 2019-08-20 DIAGNOSIS — M47812 Spondylosis without myelopathy or radiculopathy, cervical region: Secondary | ICD-10-CM | POA: Diagnosis not present

## 2019-08-20 DIAGNOSIS — M5412 Radiculopathy, cervical region: Secondary | ICD-10-CM | POA: Diagnosis not present

## 2019-08-22 DIAGNOSIS — M5416 Radiculopathy, lumbar region: Secondary | ICD-10-CM | POA: Diagnosis not present

## 2019-08-22 DIAGNOSIS — M5432 Sciatica, left side: Secondary | ICD-10-CM | POA: Diagnosis not present

## 2019-08-22 DIAGNOSIS — M9905 Segmental and somatic dysfunction of pelvic region: Secondary | ICD-10-CM | POA: Diagnosis not present

## 2019-08-22 DIAGNOSIS — M9903 Segmental and somatic dysfunction of lumbar region: Secondary | ICD-10-CM | POA: Diagnosis not present

## 2019-08-27 DIAGNOSIS — M9903 Segmental and somatic dysfunction of lumbar region: Secondary | ICD-10-CM | POA: Diagnosis not present

## 2019-08-27 DIAGNOSIS — M5432 Sciatica, left side: Secondary | ICD-10-CM | POA: Diagnosis not present

## 2019-08-27 DIAGNOSIS — M5416 Radiculopathy, lumbar region: Secondary | ICD-10-CM | POA: Diagnosis not present

## 2019-08-27 DIAGNOSIS — M9905 Segmental and somatic dysfunction of pelvic region: Secondary | ICD-10-CM | POA: Diagnosis not present

## 2019-09-02 DIAGNOSIS — M9905 Segmental and somatic dysfunction of pelvic region: Secondary | ICD-10-CM | POA: Diagnosis not present

## 2019-09-02 DIAGNOSIS — M5416 Radiculopathy, lumbar region: Secondary | ICD-10-CM | POA: Diagnosis not present

## 2019-09-02 DIAGNOSIS — M9903 Segmental and somatic dysfunction of lumbar region: Secondary | ICD-10-CM | POA: Diagnosis not present

## 2019-09-02 DIAGNOSIS — M5432 Sciatica, left side: Secondary | ICD-10-CM | POA: Diagnosis not present

## 2019-09-05 ENCOUNTER — Other Ambulatory Visit: Payer: Self-pay | Admitting: Student

## 2019-09-05 DIAGNOSIS — M7582 Other shoulder lesions, left shoulder: Secondary | ICD-10-CM

## 2019-09-05 DIAGNOSIS — M5412 Radiculopathy, cervical region: Secondary | ICD-10-CM

## 2019-09-09 DIAGNOSIS — M9903 Segmental and somatic dysfunction of lumbar region: Secondary | ICD-10-CM | POA: Diagnosis not present

## 2019-09-09 DIAGNOSIS — M5432 Sciatica, left side: Secondary | ICD-10-CM | POA: Diagnosis not present

## 2019-09-09 DIAGNOSIS — M5416 Radiculopathy, lumbar region: Secondary | ICD-10-CM | POA: Diagnosis not present

## 2019-09-09 DIAGNOSIS — M9905 Segmental and somatic dysfunction of pelvic region: Secondary | ICD-10-CM | POA: Diagnosis not present

## 2019-09-16 DIAGNOSIS — M5416 Radiculopathy, lumbar region: Secondary | ICD-10-CM | POA: Diagnosis not present

## 2019-09-16 DIAGNOSIS — M9905 Segmental and somatic dysfunction of pelvic region: Secondary | ICD-10-CM | POA: Diagnosis not present

## 2019-09-16 DIAGNOSIS — M9903 Segmental and somatic dysfunction of lumbar region: Secondary | ICD-10-CM | POA: Diagnosis not present

## 2019-09-16 DIAGNOSIS — M5432 Sciatica, left side: Secondary | ICD-10-CM | POA: Diagnosis not present

## 2019-09-22 ENCOUNTER — Ambulatory Visit
Admission: RE | Admit: 2019-09-22 | Discharge: 2019-09-22 | Disposition: A | Discharge status: Home / Self Care | Payer: PPO | Source: Ambulatory Visit | Attending: Student | Admitting: Student

## 2019-09-22 ENCOUNTER — Other Ambulatory Visit: Payer: Self-pay

## 2019-09-22 DIAGNOSIS — M19012 Primary osteoarthritis, left shoulder: Secondary | ICD-10-CM | POA: Diagnosis not present

## 2019-09-22 DIAGNOSIS — M5412 Radiculopathy, cervical region: Secondary | ICD-10-CM | POA: Insufficient documentation

## 2019-09-22 DIAGNOSIS — M7582 Other shoulder lesions, left shoulder: Secondary | ICD-10-CM

## 2019-09-22 DIAGNOSIS — M4802 Spinal stenosis, cervical region: Secondary | ICD-10-CM | POA: Diagnosis not present

## 2019-09-22 DIAGNOSIS — M50221 Other cervical disc displacement at C4-C5 level: Secondary | ICD-10-CM | POA: Diagnosis not present

## 2019-09-23 DIAGNOSIS — M9903 Segmental and somatic dysfunction of lumbar region: Secondary | ICD-10-CM | POA: Diagnosis not present

## 2019-09-23 DIAGNOSIS — M5416 Radiculopathy, lumbar region: Secondary | ICD-10-CM | POA: Diagnosis not present

## 2019-09-23 DIAGNOSIS — M5432 Sciatica, left side: Secondary | ICD-10-CM | POA: Diagnosis not present

## 2019-09-23 DIAGNOSIS — M9905 Segmental and somatic dysfunction of pelvic region: Secondary | ICD-10-CM | POA: Diagnosis not present

## 2019-09-30 DIAGNOSIS — M9903 Segmental and somatic dysfunction of lumbar region: Secondary | ICD-10-CM | POA: Diagnosis not present

## 2019-09-30 DIAGNOSIS — M5432 Sciatica, left side: Secondary | ICD-10-CM | POA: Diagnosis not present

## 2019-09-30 DIAGNOSIS — M5416 Radiculopathy, lumbar region: Secondary | ICD-10-CM | POA: Diagnosis not present

## 2019-09-30 DIAGNOSIS — M9905 Segmental and somatic dysfunction of pelvic region: Secondary | ICD-10-CM | POA: Diagnosis not present

## 2019-10-07 DIAGNOSIS — M5432 Sciatica, left side: Secondary | ICD-10-CM | POA: Diagnosis not present

## 2019-10-07 DIAGNOSIS — M5416 Radiculopathy, lumbar region: Secondary | ICD-10-CM | POA: Diagnosis not present

## 2019-10-07 DIAGNOSIS — M9905 Segmental and somatic dysfunction of pelvic region: Secondary | ICD-10-CM | POA: Diagnosis not present

## 2019-10-07 DIAGNOSIS — M9903 Segmental and somatic dysfunction of lumbar region: Secondary | ICD-10-CM | POA: Diagnosis not present

## 2019-10-21 DIAGNOSIS — M5416 Radiculopathy, lumbar region: Secondary | ICD-10-CM | POA: Diagnosis not present

## 2019-10-21 DIAGNOSIS — M9905 Segmental and somatic dysfunction of pelvic region: Secondary | ICD-10-CM | POA: Diagnosis not present

## 2019-10-21 DIAGNOSIS — M5432 Sciatica, left side: Secondary | ICD-10-CM | POA: Diagnosis not present

## 2019-10-21 DIAGNOSIS — M9903 Segmental and somatic dysfunction of lumbar region: Secondary | ICD-10-CM | POA: Diagnosis not present

## 2019-10-28 DIAGNOSIS — M9905 Segmental and somatic dysfunction of pelvic region: Secondary | ICD-10-CM | POA: Diagnosis not present

## 2019-10-28 DIAGNOSIS — M5416 Radiculopathy, lumbar region: Secondary | ICD-10-CM | POA: Diagnosis not present

## 2019-10-28 DIAGNOSIS — M5432 Sciatica, left side: Secondary | ICD-10-CM | POA: Diagnosis not present

## 2019-10-28 DIAGNOSIS — M9903 Segmental and somatic dysfunction of lumbar region: Secondary | ICD-10-CM | POA: Diagnosis not present

## 2019-11-04 DIAGNOSIS — M5432 Sciatica, left side: Secondary | ICD-10-CM | POA: Diagnosis not present

## 2019-11-04 DIAGNOSIS — M9905 Segmental and somatic dysfunction of pelvic region: Secondary | ICD-10-CM | POA: Diagnosis not present

## 2019-11-04 DIAGNOSIS — M5416 Radiculopathy, lumbar region: Secondary | ICD-10-CM | POA: Diagnosis not present

## 2019-11-04 DIAGNOSIS — M9903 Segmental and somatic dysfunction of lumbar region: Secondary | ICD-10-CM | POA: Diagnosis not present

## 2019-11-11 DIAGNOSIS — E538 Deficiency of other specified B group vitamins: Secondary | ICD-10-CM | POA: Diagnosis not present

## 2019-11-11 DIAGNOSIS — D500 Iron deficiency anemia secondary to blood loss (chronic): Secondary | ICD-10-CM | POA: Diagnosis not present

## 2019-11-18 DIAGNOSIS — E538 Deficiency of other specified B group vitamins: Secondary | ICD-10-CM | POA: Insufficient documentation

## 2019-11-18 DIAGNOSIS — D369 Benign neoplasm, unspecified site: Secondary | ICD-10-CM | POA: Diagnosis not present

## 2019-11-18 DIAGNOSIS — M5432 Sciatica, left side: Secondary | ICD-10-CM | POA: Diagnosis not present

## 2019-11-18 DIAGNOSIS — D500 Iron deficiency anemia secondary to blood loss (chronic): Secondary | ICD-10-CM | POA: Diagnosis not present

## 2019-11-18 DIAGNOSIS — M9903 Segmental and somatic dysfunction of lumbar region: Secondary | ICD-10-CM | POA: Diagnosis not present

## 2019-11-18 DIAGNOSIS — M9905 Segmental and somatic dysfunction of pelvic region: Secondary | ICD-10-CM | POA: Diagnosis not present

## 2019-11-18 DIAGNOSIS — M5416 Radiculopathy, lumbar region: Secondary | ICD-10-CM | POA: Diagnosis not present

## 2019-11-20 ENCOUNTER — Other Ambulatory Visit: Payer: Self-pay

## 2019-11-20 ENCOUNTER — Encounter: Payer: Self-pay | Admitting: Oncology

## 2019-11-20 NOTE — Progress Notes (Signed)
Patient is here today for new patient consult for anemia.

## 2019-11-21 ENCOUNTER — Other Ambulatory Visit: Payer: Self-pay

## 2019-11-21 ENCOUNTER — Inpatient Hospital Stay: Payer: PPO

## 2019-11-21 ENCOUNTER — Inpatient Hospital Stay: Payer: PPO | Attending: Oncology | Admitting: Oncology

## 2019-11-21 VITALS — BP 131/79 | HR 97 | Temp 96.8°F | Resp 18 | Wt 151.2 lb

## 2019-11-21 DIAGNOSIS — D509 Iron deficiency anemia, unspecified: Secondary | ICD-10-CM | POA: Insufficient documentation

## 2019-11-21 LAB — CBC
HCT: 33.7 % — ABNORMAL LOW (ref 36.0–46.0)
Hemoglobin: 10.4 g/dL — ABNORMAL LOW (ref 12.0–15.0)
MCH: 27.9 pg (ref 26.0–34.0)
MCHC: 30.9 g/dL (ref 30.0–36.0)
MCV: 90.3 fL (ref 80.0–100.0)
Platelets: 222 10*3/uL (ref 150–400)
RBC: 3.73 MIL/uL — ABNORMAL LOW (ref 3.87–5.11)
RDW: 13.9 % (ref 11.5–15.5)
WBC: 4.4 10*3/uL (ref 4.0–10.5)
nRBC: 0 % (ref 0.0–0.2)

## 2019-11-21 LAB — FERRITIN: Ferritin: 7 ng/mL — ABNORMAL LOW (ref 11–307)

## 2019-11-21 LAB — RETICULOCYTES
Immature Retic Fract: 8 % (ref 2.3–15.9)
RBC.: 3.73 MIL/uL — ABNORMAL LOW (ref 3.87–5.11)
Retic Count, Absolute: 47 10*3/uL (ref 19.0–186.0)
Retic Ct Pct: 1.3 % (ref 0.4–3.1)

## 2019-11-21 LAB — VITAMIN B12: Vitamin B-12: 425 pg/mL (ref 180–914)

## 2019-11-21 LAB — IRON AND TIBC
Iron: 45 ug/dL (ref 28–170)
Saturation Ratios: 11 % (ref 10.4–31.8)
TIBC: 425 ug/dL (ref 250–450)
UIBC: 381 ug/dL

## 2019-11-21 LAB — LACTATE DEHYDROGENASE: LDH: 108 U/L (ref 98–192)

## 2019-11-21 LAB — DAT, POLYSPECIFIC AHG (ARMC ONLY): Polyspecific AHG test: NEGATIVE

## 2019-11-21 LAB — FOLATE: Folate: 28 ng/mL (ref >5.9)

## 2019-11-21 NOTE — Progress Notes (Signed)
Becky Lam  [phone] Fax:(336) [phone]  ID: Leonor Liv OB: 1946/07/19  MR#: [id number]  [id number]  Patient Care Team: Rusty Aus, MD as PCP - General (Internal Medicine) Rusty Aus, MD (Internal Medicine) Bary Castilla Forest Gleason, MD (General Surgery)  CHIEF COMPLAINT: Iron deficiency anemia.  INTERVAL HISTORY: Patient is a 73 year old female with persistent iron deficiency anemia despite oral iron supplementation.  She currently feels well and is asymptomatic.  She does not complain of any weakness or fatigue.  She denies any recent fevers or illnesses.  She has a good appetite and denies weight loss.  She has no chest pain, shortness of breath, cough, or hemoptysis.  She has no nausea, vomiting, constipation, or diarrhea.  She has no melena or hematochezia.  She has no urinary complaints.  Patient feels at her baseline offers no specific complaints today.  REVIEW OF SYSTEMS:   Review of Systems  Constitutional: Negative.  Negative for fever, malaise/fatigue and weight loss.  Respiratory: Negative.  Negative for cough and shortness of breath.   Cardiovascular: Negative.  Negative for chest pain and leg swelling.  Gastrointestinal: Negative.  Negative for abdominal pain, blood in stool and melena.  Genitourinary: Negative.  Negative for hematuria.  Musculoskeletal: Negative.  Negative for back pain.  Skin: Negative.  Negative for rash.  Neurological: Negative.  Negative for dizziness, focal weakness, weakness and headaches.  Psychiatric/Behavioral: Negative.  The patient is not nervous/anxious.     As per HPI. Otherwise, a complete review of systems is negative.  PAST MEDICAL HISTORY: Past Medical History:  Diagnosis Date  . Complication of anesthesia   . Dysrhythmia    fast heart beat  . PONV (postoperative nausea and vomiting)    AFTER KNEE SURGERY    MAY HAVE BEEN PAIN PILL    PAST SURGICAL HISTORY: Past Surgical History:    Procedure Laterality Date  . CATARACT EXTRACTION W/PHACO Left 05/23/2016   Procedure: CATARACT EXTRACTION PHACO AND INTRAOCULAR LENS PLACEMENT (IOC);  Surgeon: Leandrew Koyanagi, MD;  Location: [facility];  Service: Ophthalmology;  Laterality: Left;  [place]  01:13 AP% 17.3 CDE 12.65 fluid pack lot # [id number]  . CATARACT EXTRACTION W/PHACO Right 06/21/2016   Procedure: CATARACT EXTRACTION PHACO AND INTRAOCULAR LENS PLACEMENT (IOC) right eye;  Surgeon: Leandrew Koyanagi, MD;  Location: [place];  Service: Ophthalmology;  Laterality: Right;  . COLONOSCOPY  03/31/15  . FINGER SURGERY Right 2003   middle   . KNEE ARTHROSCOPY Left 2013  . ROTATOR CUFF REPAIR Left 11/20/2006    FAMILY HISTORY: Family History  Problem Relation Age of Onset  . Cancer Neg Hx   . Breast cancer Neg Hx     ADVANCED DIRECTIVES (Y/N):  N  HEALTH MAINTENANCE: Social History   Tobacco Use  . Smoking status: Never Smoker  Substance Use Topics  . Alcohol use: No    Alcohol/week: 0.0 standard drinks  . Drug use: No     Colonoscopy:  PAP:  Bone density:  Lipid panel:  No Known Allergies  Current Outpatient Medications  Medication Sig Dispense Refill  . aspirin 81 MG tablet Take 81 mg by mouth daily.    . hydrochlorothiazide (HYDRODIURIL) 25 MG tablet TK 1 T PO QD  1  . Misc Natural Products (CALCIUM PLUS ADVANCED PO) Take by mouth 2 (two) times daily.    . Misc Natural Products (EQL GREEN TEA COMPLEX) TABS Take by mouth daily.    . Multiple Vitamins-Minerals (MULTIVITAMIN WITH  MINERALS) tablet Take 1 tablet by mouth daily.    . Omega-3 Fatty Acids (FISH OIL) 1000 MG CAPS Take by mouth daily.    . phentermine 15 MG capsule TK ONE C PO ONCE D  3  . Vitamin D, Cholecalciferol, 1000 units TABS Take by mouth daily.    Marland Kitchen latanoprost (XALATAN) 0.005 % ophthalmic solution INT 1 GTT IN OU QD  6  . loratadine (ALLERGY) 10 MG tablet Take 10 mg by mouth daily.     No current facility-administered  medications for this visit.    OBJECTIVE: Vitals:   11/21/19 1040  BP: 131/79  Pulse: 97  Resp: 18  Temp: (!) 96.8 F (36 C)  SpO2: 99%     Body mass index is 26.78 kg/m.    ECOG FS:0 - Asymptomatic  General: Well-developed, well-nourished, no acute distress. Eyes: Pink conjunctiva, anicteric sclera. HEENT: Normocephalic, moist mucous membranes. Lungs: No audible wheezing or coughing. Heart: Regular rate and rhythm. Abdomen: Soft, nontender, no obvious distention. Musculoskeletal: No edema, cyanosis, or clubbing. Neuro: Alert, answering all questions appropriately. Cranial nerves grossly intact. Skin: No rashes or petechiae noted. Psych: Normal affect. Lymphatics: No cervical, calvicular, axillary or inguinal LAD.   LAB RESULTS:  Lab Results  Component Value Date   K 3.8 10/17/2012    Lab Results  Component Value Date   WBC 4.4 11/21/2019   HGB 10.4 (L) 11/21/2019   HCT 33.7 (L) 11/21/2019   MCV 90.3 11/21/2019   PLT 222 11/21/2019   Lab Results  Component Value Date   IRON 45 11/21/2019   TIBC 425 11/21/2019   IRONPCTSAT 11 11/21/2019   Lab Results  Component Value Date   FERRITIN 7 (L) 11/21/2019     STUDIES: No results found.  ASSESSMENT: Iron deficiency anemia.  PLAN:    1.  Iron deficiency anemia: Patient reports a normal colonoscopy and EGD several years ago, but we do not have these results in hand.  Despite oral iron supplementation, her hemoglobin is decreased to 10.4 with reduced iron stores of iron saturation of 11% and ferritin of 7.  Her reticulocyte count is also inappropriately normal.  There is no evidence of hemolysis and folate is within normal limits.  The remainder of her laboratory work is pending at time of dictation.  Return to clinic in 1 and 2 weeks for 510 mg IV Feraheme.  Patient will then return to clinic in 2 months with repeat laboratory work and further evaluation.  I spent a total of 45 minutes face-to-face with the  patient and reviewing chart data of which greater than 50% of the visit was spent in counseling and coordination of care as detailed above.   Patient expressed understanding and was in agreement with this plan. She also understands that She can call clinic at any time with any questions, concerns, or complaints.    Lloyd Huger, MD   11/21/2019 3:23 PM

## 2019-11-22 LAB — HAPTOGLOBIN: Haptoglobin: 141 mg/dL (ref 42–346)

## 2019-11-24 LAB — PROTEIN ELECTROPHORESIS, SERUM
A/G Ratio: 1.3 (ref 0.7–1.7)
Albumin ELP: 3.7 g/dL (ref 2.9–4.4)
Alpha-1-Globulin: 0.2 g/dL (ref 0.0–0.4)
Alpha-2-Globulin: 0.6 g/dL (ref 0.4–1.0)
Beta Globulin: 1 g/dL (ref 0.7–1.3)
Gamma Globulin: 1 g/dL (ref 0.4–1.8)
Globulin, Total: 2.8 g/dL (ref 2.2–3.9)
Total Protein ELP: 6.5 g/dL (ref 6.0–8.5)

## 2019-11-24 LAB — ERYTHROPOIETIN: Erythropoietin: 16 m[IU]/mL (ref 2.6–18.5)

## 2019-11-26 ENCOUNTER — Inpatient Hospital Stay: Payer: PPO

## 2019-11-26 ENCOUNTER — Other Ambulatory Visit: Payer: Self-pay

## 2019-11-26 VITALS — BP 147/70 | HR 68 | Temp 97.0°F | Resp 18

## 2019-11-26 DIAGNOSIS — D509 Iron deficiency anemia, unspecified: Secondary | ICD-10-CM

## 2019-11-26 MED ORDER — SODIUM CHLORIDE 0.9 % IV SOLN
Freq: Once | INTRAVENOUS | Status: AC
  Filled 2019-11-26: qty 250

## 2019-11-26 MED ORDER — SODIUM CHLORIDE 0.9 % IV SOLN
510.0000 mg | Freq: Once | INTRAVENOUS | Status: AC
  Administered 2019-11-26: 510 mg via INTRAVENOUS
  Filled 2019-11-26: qty 17

## 2019-11-26 NOTE — Progress Notes (Signed)
Pt tolerated first time infusion of Feraheme well with no signs of complications or reaction. Pt was observed post infusion for 30 minutes. RN educated pt on the importance of notifying the clinic if any signs of complications occur at home and if it is an emergency to call 911. Pt verbalized understanding and all questions answered at this time. VSS, pt stable for discharge.   Gayna Braddy [employer]

## 2019-12-02 ENCOUNTER — Other Ambulatory Visit: Payer: Self-pay

## 2019-12-02 ENCOUNTER — Inpatient Hospital Stay: Payer: PPO

## 2019-12-02 VITALS — BP 141/78 | HR 77 | Temp 98.1°F | Resp 18

## 2019-12-02 DIAGNOSIS — M5432 Sciatica, left side: Secondary | ICD-10-CM | POA: Diagnosis not present

## 2019-12-02 DIAGNOSIS — D509 Iron deficiency anemia, unspecified: Secondary | ICD-10-CM

## 2019-12-02 DIAGNOSIS — M5416 Radiculopathy, lumbar region: Secondary | ICD-10-CM | POA: Diagnosis not present

## 2019-12-02 DIAGNOSIS — M9905 Segmental and somatic dysfunction of pelvic region: Secondary | ICD-10-CM | POA: Diagnosis not present

## 2019-12-02 DIAGNOSIS — M9903 Segmental and somatic dysfunction of lumbar region: Secondary | ICD-10-CM | POA: Diagnosis not present

## 2019-12-02 MED ORDER — SODIUM CHLORIDE 0.9 % IV SOLN
Freq: Once | INTRAVENOUS | Status: AC
  Filled 2019-12-02: qty 250

## 2019-12-02 MED ORDER — SODIUM CHLORIDE 0.9 % IV SOLN
510.0000 mg | Freq: Once | INTRAVENOUS | Status: AC
  Administered 2019-12-02: 510 mg via INTRAVENOUS
  Filled 2019-12-02: qty 510

## 2019-12-19 DIAGNOSIS — D2271 Melanocytic nevi of right lower limb, including hip: Secondary | ICD-10-CM | POA: Diagnosis not present

## 2019-12-19 DIAGNOSIS — L821 Other seborrheic keratosis: Secondary | ICD-10-CM | POA: Diagnosis not present

## 2019-12-19 DIAGNOSIS — D2272 Melanocytic nevi of left lower limb, including hip: Secondary | ICD-10-CM | POA: Diagnosis not present

## 2019-12-19 DIAGNOSIS — D2262 Melanocytic nevi of left upper limb, including shoulder: Secondary | ICD-10-CM | POA: Diagnosis not present

## 2019-12-19 DIAGNOSIS — D2261 Melanocytic nevi of right upper limb, including shoulder: Secondary | ICD-10-CM | POA: Diagnosis not present

## 2019-12-19 DIAGNOSIS — L814 Other melanin hyperpigmentation: Secondary | ICD-10-CM | POA: Diagnosis not present

## 2019-12-19 DIAGNOSIS — D225 Melanocytic nevi of trunk: Secondary | ICD-10-CM | POA: Diagnosis not present

## 2020-01-06 DIAGNOSIS — M5432 Sciatica, left side: Secondary | ICD-10-CM | POA: Diagnosis not present

## 2020-01-06 DIAGNOSIS — M5416 Radiculopathy, lumbar region: Secondary | ICD-10-CM | POA: Diagnosis not present

## 2020-01-06 DIAGNOSIS — M9905 Segmental and somatic dysfunction of pelvic region: Secondary | ICD-10-CM | POA: Diagnosis not present

## 2020-01-06 DIAGNOSIS — M9903 Segmental and somatic dysfunction of lumbar region: Secondary | ICD-10-CM | POA: Diagnosis not present

## 2020-01-24 NOTE — Progress Notes (Signed)
[name]  Telephone:(336) [phone] Fax:(336) [phone]  ID: Becky Lam OB: 08/27/1946  MR#: [id number]  [id number]  Patient Care Team: Rusty Aus, MD as PCP - General (Internal Medicine) Rusty Aus, MD (Internal Medicine) Bary Castilla Forest Gleason, MD (General Surgery)  CHIEF COMPLAINT: Iron deficiency anemia.  INTERVAL HISTORY: Patient returns to clinic today for repeat laboratory work and further evaluation.  She continues to feel well and remains asymptomatic.  She does not complain of any weakness or fatigue.  She has no neurologic complaints.  She denies any recent fevers or illnesses.  She has a good appetite and denies weight loss.  She has no chest pain, shortness of breath, cough, or hemoptysis.  She has no nausea, vomiting, constipation, or diarrhea.  She has no melena or hematochezia.  She has no urinary complaints.  Patient offers no specific complaints today.  REVIEW OF SYSTEMS:   Review of Systems  Constitutional: Negative.  Negative for fever, malaise/fatigue and weight loss.  Respiratory: Negative.  Negative for cough and shortness of breath.   Cardiovascular: Negative.  Negative for chest pain and leg swelling.  Gastrointestinal: Negative.  Negative for abdominal pain, blood in stool and melena.  Genitourinary: Negative.  Negative for hematuria.  Musculoskeletal: Negative.  Negative for back pain.  Skin: Negative.  Negative for rash.  Neurological: Negative.  Negative for dizziness, focal weakness, weakness and headaches.  Psychiatric/Behavioral: Negative.  The patient is not nervous/anxious.     As per HPI. Otherwise, a complete review of systems is negative.  PAST MEDICAL HISTORY: Past Medical History:  Diagnosis Date  . Complication of anesthesia   . Dysrhythmia    fast heart beat  . PONV (postoperative nausea and vomiting)    AFTER KNEE SURGERY    MAY HAVE BEEN PAIN PILL    PAST SURGICAL HISTORY: Past Surgical History:   Procedure Laterality Date  . CATARACT EXTRACTION W/PHACO Left 05/23/2016   Procedure: CATARACT EXTRACTION PHACO AND INTRAOCULAR LENS PLACEMENT (IOC);  Surgeon: Leandrew Koyanagi, MD;  Location: [facility];  Service: Ophthalmology;  Laterality: Left;  [place]  01:13 AP% 17.3 CDE 12.65 fluid pack lot # [id number]  . CATARACT EXTRACTION W/PHACO Right 06/21/2016   Procedure: CATARACT EXTRACTION PHACO AND INTRAOCULAR LENS PLACEMENT (IOC) right eye;  Surgeon: Leandrew Koyanagi, MD;  Location: [place];  Service: Ophthalmology;  Laterality: Right;  . COLONOSCOPY  03/31/15  . FINGER SURGERY Right 2003   middle   . KNEE ARTHROSCOPY Left 2013  . ROTATOR CUFF REPAIR Left 11/20/2006    FAMILY HISTORY: Family History  Problem Relation Age of Onset  . Cancer Neg Hx   . Breast cancer Neg Hx     ADVANCED DIRECTIVES (Y/N):  N  HEALTH MAINTENANCE: Social History   Tobacco Use  . Smoking status: Never Smoker  Substance Use Topics  . Alcohol use: No    Alcohol/week: 0.0 standard drinks  . Drug use: No     Colonoscopy:  PAP:  Bone density:  Lipid panel:  Allergies  Allergen Reactions  . Poison Oak Extract Rash    Current Outpatient Medications  Medication Sig Dispense Refill  . Ascorbic Acid (VITAMIN C) 1000 MG tablet Take 1,000 mg by mouth daily.    Marland Kitchen aspirin 81 MG tablet Take 81 mg by mouth daily.    . iron polysaccharides (NIFEREX) 150 MG capsule Take 1 capsule by mouth 2 (two) times daily.    . Misc Natural Products (CALCIUM PLUS ADVANCED PO)  Take by mouth 2 (two) times daily.    . Misc Natural Products (EQL GREEN TEA COMPLEX) TABS Take by mouth daily.    . Multiple Vitamins-Minerals (MULTIVITAMIN WITH MINERALS) tablet Take 1 tablet by mouth daily.    . Omega-3 Fatty Acids (FISH OIL) 1000 MG CAPS Take by mouth daily.    . vitamin B-12 (CYANOCOBALAMIN) 500 MCG tablet Take 500 mcg by mouth daily.    . Vitamin D, Cholecalciferol, 1000 units TABS Take by mouth daily.    . Zinc  50 MG TABS Take by mouth daily.     No current facility-administered medications for this visit.    OBJECTIVE: Vitals:   01/27/20 1306  BP: 125/66  Pulse: 79  Resp: 18  Temp: (!) 97.5 F (36.4 C)  SpO2: 100%     Body mass index is 27.16 kg/m.    ECOG FS:0 - Asymptomatic  General: Well-developed, well-nourished, no acute distress. Eyes: Pink conjunctiva, anicteric sclera. HEENT: Normocephalic, moist mucous membranes. Lungs: No audible wheezing or coughing. Heart: Regular rate and rhythm. Abdomen: Soft, nontender, no obvious distention. Musculoskeletal: No edema, cyanosis, or clubbing. Neuro: Alert, answering all questions appropriately. Cranial nerves grossly intact. Skin: No rashes or petechiae noted. Psych: Normal affect.  LAB RESULTS:  Lab Results  Component Value Date   K 3.8 10/17/2012    Lab Results  Component Value Date   WBC 5.8 01/27/2020   NEUTROABS 3.8 01/27/2020   HGB 11.3 (L) 01/27/2020   HCT 35.7 (L) 01/27/2020   MCV 92.2 01/27/2020   PLT 188 01/27/2020   Lab Results  Component Value Date   IRON 45 11/21/2019   TIBC 425 11/21/2019   IRONPCTSAT 11 11/21/2019   Lab Results  Component Value Date   FERRITIN 7 (L) 11/21/2019     STUDIES: No results found.  ASSESSMENT: Iron deficiency anemia.  PLAN:    1.  Iron deficiency anemia: Patient reports a normal colonoscopy and EGD several years ago, but we do not have these results in hand.  Initial treatment with oral iron supplementation did not appreciably increase her iron stores her hemoglobin.  Previously, the remainder of her laboratory work was either negative or within normal limits.  Her hemoglobin has improved to 11.3 with IV Feraheme.  Iron stores are pending at time of dictation.  She does not require additional treatment today.  Return to clinic in 4 months for repeat laboratory work, further evaluation, and continuation of treatment if needed.    I spent a total of 20 minutes reviewing  chart data, face-to-face evaluation with the patient, counseling and coordination of care as detailed above.    Patient expressed understanding and was in agreement with this plan. She also understands that She can call clinic at any time with any questions, concerns, or complaints.    Lloyd Huger, MD   01/27/2020 2:08 PM

## 2020-01-26 ENCOUNTER — Other Ambulatory Visit: Payer: Self-pay | Admitting: *Deleted

## 2020-01-26 DIAGNOSIS — D509 Iron deficiency anemia, unspecified: Secondary | ICD-10-CM

## 2020-01-27 ENCOUNTER — Inpatient Hospital Stay: Payer: PPO

## 2020-01-27 ENCOUNTER — Other Ambulatory Visit: Payer: Self-pay

## 2020-01-27 ENCOUNTER — Inpatient Hospital Stay (HOSPITAL_BASED_OUTPATIENT_CLINIC_OR_DEPARTMENT_OTHER): Payer: PPO | Admitting: Oncology

## 2020-01-27 ENCOUNTER — Encounter: Payer: Self-pay | Admitting: Oncology

## 2020-01-27 ENCOUNTER — Inpatient Hospital Stay: Payer: PPO | Attending: Oncology

## 2020-01-27 VITALS — BP 125/66 | HR 79 | Temp 97.5°F | Resp 18 | Wt 153.3 lb

## 2020-01-27 DIAGNOSIS — D509 Iron deficiency anemia, unspecified: Secondary | ICD-10-CM

## 2020-01-27 LAB — CBC WITH DIFFERENTIAL/PLATELET
Abs Immature Granulocytes: 0.02 10*3/uL (ref 0.00–0.07)
Basophils Absolute: 0 10*3/uL (ref 0.0–0.1)
Basophils Relative: 1 %
Eosinophils Absolute: 0 10*3/uL (ref 0.0–0.5)
Eosinophils Relative: 1 %
HCT: 35.7 % — ABNORMAL LOW (ref 36.0–46.0)
Hemoglobin: 11.3 g/dL — ABNORMAL LOW (ref 12.0–15.0)
Immature Granulocytes: 0 %
Lymphocytes Relative: 24 %
Lymphs Abs: 1.4 10*3/uL (ref 0.7–4.0)
MCH: 29.2 pg (ref 26.0–34.0)
MCHC: 31.7 g/dL (ref 30.0–36.0)
MCV: 92.2 fL (ref 80.0–100.0)
Monocytes Absolute: 0.5 10*3/uL (ref 0.1–1.0)
Monocytes Relative: 9 %
Neutro Abs: 3.8 10*3/uL (ref 1.7–7.7)
Neutrophils Relative %: 65 %
Platelets: 188 10*3/uL (ref 150–400)
RBC: 3.87 MIL/uL (ref 3.87–5.11)
RDW: 15.4 % (ref 11.5–15.5)
WBC: 5.8 10*3/uL (ref 4.0–10.5)
nRBC: 0 % (ref 0.0–0.2)

## 2020-01-27 LAB — FERRITIN: Ferritin: 46 ng/mL (ref 11–307)

## 2020-01-27 LAB — IRON AND TIBC
Iron: 77 ug/dL (ref 28–170)
Saturation Ratios: 25 % (ref 10.4–31.8)
TIBC: 314 ug/dL (ref 250–450)
UIBC: 237 ug/dL

## 2020-01-27 NOTE — Progress Notes (Signed)
Pt here for follow up for IDA. No questions or concerns.

## 2020-02-03 DIAGNOSIS — M9905 Segmental and somatic dysfunction of pelvic region: Secondary | ICD-10-CM | POA: Diagnosis not present

## 2020-02-03 DIAGNOSIS — M5416 Radiculopathy, lumbar region: Secondary | ICD-10-CM | POA: Diagnosis not present

## 2020-02-03 DIAGNOSIS — M5432 Sciatica, left side: Secondary | ICD-10-CM | POA: Diagnosis not present

## 2020-02-03 DIAGNOSIS — M9903 Segmental and somatic dysfunction of lumbar region: Secondary | ICD-10-CM | POA: Diagnosis not present

## 2020-02-16 DIAGNOSIS — D500 Iron deficiency anemia secondary to blood loss (chronic): Secondary | ICD-10-CM | POA: Diagnosis not present

## 2020-02-17 DIAGNOSIS — H401131 Primary open-angle glaucoma, bilateral, mild stage: Secondary | ICD-10-CM | POA: Diagnosis not present

## 2020-02-24 DIAGNOSIS — H26491 Other secondary cataract, right eye: Secondary | ICD-10-CM | POA: Diagnosis not present

## 2020-03-02 DIAGNOSIS — M9903 Segmental and somatic dysfunction of lumbar region: Secondary | ICD-10-CM | POA: Diagnosis not present

## 2020-03-02 DIAGNOSIS — M5416 Radiculopathy, lumbar region: Secondary | ICD-10-CM | POA: Diagnosis not present

## 2020-03-02 DIAGNOSIS — M9905 Segmental and somatic dysfunction of pelvic region: Secondary | ICD-10-CM | POA: Diagnosis not present

## 2020-03-02 DIAGNOSIS — M5432 Sciatica, left side: Secondary | ICD-10-CM | POA: Diagnosis not present

## 2020-03-22 DIAGNOSIS — H26491 Other secondary cataract, right eye: Secondary | ICD-10-CM | POA: Diagnosis not present

## 2020-03-30 DIAGNOSIS — M9903 Segmental and somatic dysfunction of lumbar region: Secondary | ICD-10-CM | POA: Diagnosis not present

## 2020-03-30 DIAGNOSIS — M5432 Sciatica, left side: Secondary | ICD-10-CM | POA: Diagnosis not present

## 2020-03-30 DIAGNOSIS — M5416 Radiculopathy, lumbar region: Secondary | ICD-10-CM | POA: Diagnosis not present

## 2020-03-30 DIAGNOSIS — M9905 Segmental and somatic dysfunction of pelvic region: Secondary | ICD-10-CM | POA: Diagnosis not present

## 2020-04-13 DIAGNOSIS — H35343 Macular cyst, hole, or pseudohole, bilateral: Secondary | ICD-10-CM | POA: Diagnosis not present

## 2020-04-27 DIAGNOSIS — M5136 Other intervertebral disc degeneration, lumbar region: Secondary | ICD-10-CM | POA: Diagnosis not present

## 2020-04-27 DIAGNOSIS — M955 Acquired deformity of pelvis: Secondary | ICD-10-CM | POA: Diagnosis not present

## 2020-04-27 DIAGNOSIS — M9903 Segmental and somatic dysfunction of lumbar region: Secondary | ICD-10-CM | POA: Diagnosis not present

## 2020-04-27 DIAGNOSIS — M9905 Segmental and somatic dysfunction of pelvic region: Secondary | ICD-10-CM | POA: Diagnosis not present

## 2020-05-11 ENCOUNTER — Other Ambulatory Visit: Payer: Self-pay | Admitting: Internal Medicine

## 2020-05-11 DIAGNOSIS — Z1231 Encounter for screening mammogram for malignant neoplasm of breast: Secondary | ICD-10-CM

## 2020-05-19 DIAGNOSIS — E538 Deficiency of other specified B group vitamins: Secondary | ICD-10-CM | POA: Diagnosis not present

## 2020-05-19 DIAGNOSIS — D500 Iron deficiency anemia secondary to blood loss (chronic): Secondary | ICD-10-CM | POA: Diagnosis not present

## 2020-05-25 ENCOUNTER — Ambulatory Visit
Admission: RE | Admit: 2020-05-25 | Discharge: 2020-05-25 | Disposition: A | Discharge status: Home / Self Care | Payer: PPO | Source: Ambulatory Visit | Attending: Internal Medicine | Admitting: Internal Medicine

## 2020-05-25 DIAGNOSIS — Z1231 Encounter for screening mammogram for malignant neoplasm of breast: Secondary | ICD-10-CM | POA: Diagnosis not present

## 2020-05-25 DIAGNOSIS — M5136 Other intervertebral disc degeneration, lumbar region: Secondary | ICD-10-CM | POA: Diagnosis not present

## 2020-05-25 DIAGNOSIS — M955 Acquired deformity of pelvis: Secondary | ICD-10-CM | POA: Diagnosis not present

## 2020-05-25 DIAGNOSIS — M9905 Segmental and somatic dysfunction of pelvic region: Secondary | ICD-10-CM | POA: Diagnosis not present

## 2020-05-25 DIAGNOSIS — M9903 Segmental and somatic dysfunction of lumbar region: Secondary | ICD-10-CM | POA: Diagnosis not present

## 2020-05-25 NOTE — Progress Notes (Signed)
[place]  [phone]) [phone] Fax:(336) [phone]  ID: Becky Lam OB: Dec 11, 1946  MR#: [id number]  BSJ#:[id number]  Patient Care Team: Rusty Aus, MD as PCP - General (Internal Medicine) Rusty Aus, MD (Internal Medicine) Bary Castilla Forest Gleason, MD (General Surgery) Lloyd Huger, MD as Consulting Physician (Oncology)  CHIEF COMPLAINT: Iron deficiency anemia.  INTERVAL HISTORY: Patient returns to clinic today for repeat laboratory work, further evaluation, and consideration of additional IV Feraheme.  She continues to feel well and remains asymptomatic.  She does not complain of any weakness or fatigue. She has no neurologic complaints.  She denies any recent fevers or illnesses.  She has a good appetite and denies weight loss.  She has no chest pain, shortness of breath, cough, or hemoptysis.  She has no nausea, vomiting, constipation, or diarrhea.  She has no melena or hematochezia.  She has no urinary complaints.  Patient offers no specific complaints today.  REVIEW OF SYSTEMS:   Review of Systems  Constitutional: Negative.  Negative for fever, malaise/fatigue and weight loss.  Respiratory: Negative.  Negative for cough and shortness of breath.   Cardiovascular: Negative.  Negative for chest pain and leg swelling.  Gastrointestinal: Negative.  Negative for abdominal pain, blood in stool and melena.  Genitourinary: Negative.  Negative for hematuria.  Musculoskeletal: Negative.  Negative for back pain.  Skin: Negative.  Negative for rash.  Neurological: Negative.  Negative for dizziness, focal weakness, weakness and headaches.  Psychiatric/Behavioral: Negative.  The patient is not nervous/anxious.     As per HPI. Otherwise, a complete review of systems is negative.  PAST MEDICAL HISTORY: Past Medical History:  Diagnosis Date  . Complication of anesthesia   . Dysrhythmia    fast heart beat  . PONV (postoperative nausea and vomiting)    AFTER  KNEE SURGERY    MAY HAVE BEEN PAIN PILL    PAST SURGICAL HISTORY: Past Surgical History:  Procedure Laterality Date  . CATARACT EXTRACTION W/PHACO Left 05/23/2016   Procedure: CATARACT EXTRACTION PHACO AND INTRAOCULAR LENS PLACEMENT (IOC);  Surgeon: Leandrew Koyanagi, MD;  Location: [facility];  Service: Ophthalmology;  Laterality: Left;  [place]  01:13 AP% 17.3 CDE 12.65 fluid pack lot # [id number]  . CATARACT EXTRACTION W/PHACO Right 06/21/2016   Procedure: CATARACT EXTRACTION PHACO AND INTRAOCULAR LENS PLACEMENT (IOC) right eye;  Surgeon: Leandrew Koyanagi, MD;  Location: [place];  Service: Ophthalmology;  Laterality: Right;  . COLONOSCOPY  03/31/15  . FINGER SURGERY Right 2003   middle   . KNEE ARTHROSCOPY Left 2013  . ROTATOR CUFF REPAIR Left 11/20/2006    FAMILY HISTORY: Family History  Problem Relation Age of Onset  . Cancer Neg Hx   . Breast cancer Neg Hx     ADVANCED DIRECTIVES (Y/N):  N  HEALTH MAINTENANCE: Social History   Tobacco Use  . Smoking status: Never Smoker  . Smokeless tobacco: Never Used  Substance Use Topics  . Alcohol use: No    Alcohol/week: 0.0 standard drinks  . Drug use: No     Colonoscopy:  PAP:  Bone density:  Lipid panel:  Allergies  Allergen Reactions  . Poison Oak Extract Rash    Current Outpatient Medications  Medication Sig Dispense Refill  . Ascorbic Acid (VITAMIN C) 1000 MG tablet Take 1,000 mg by mouth daily.    Marland Kitchen aspirin 81 MG tablet Take 81 mg by mouth daily.    . iron polysaccharides (NIFEREX) 150 MG capsule Take 1  capsule by mouth 2 (two) times daily.    . Misc Natural Products (CALCIUM PLUS ADVANCED PO) Take by mouth 2 (two) times daily.    . Misc Natural Products (EQL GREEN TEA COMPLEX) TABS Take by mouth daily.    . Multiple Vitamins-Minerals (MULTIVITAMIN WITH MINERALS) tablet Take 1 tablet by mouth daily.    . Omega-3 Fatty Acids (FISH OIL) 1000 MG CAPS Take by mouth daily.    . vitamin B-12  (CYANOCOBALAMIN) 500 MCG tablet Take 500 mcg by mouth daily.    . Vitamin D, Cholecalciferol, 1000 units TABS Take by mouth daily.    . Zinc 50 MG TABS Take by mouth daily.     No current facility-administered medications for this visit.    OBJECTIVE: Vitals:   05/27/20 1400  BP: 136/72  Pulse: 81  Resp: 20  Temp: 97.8 F (36.6 C)  SpO2: 100%     Body mass index is 27.97 kg/m.    ECOG FS:0 - Asymptomatic  General: Well-developed, well-nourished, no acute distress. Eyes: Pink conjunctiva, anicteric sclera. HEENT: Normocephalic, moist mucous membranes. Lungs: No audible wheezing or coughing. Heart: Regular rate and rhythm. Abdomen: Soft, nontender, no obvious distention. Musculoskeletal: No edema, cyanosis, or clubbing. Neuro: Alert, answering all questions appropriately. Cranial nerves grossly intact. Skin: No rashes or petechiae noted. Psych: Normal affect.  LAB RESULTS:  Lab Results  Component Value Date   K 3.8 10/17/2012    Lab Results  Component Value Date   WBC 5.6 05/27/2020   NEUTROABS 3.5 05/27/2020   HGB 10.8 (L) 05/27/2020   HCT 32.7 (L) 05/27/2020   MCV 88.4 05/27/2020   PLT 206 05/27/2020   Lab Results  Component Value Date   IRON 34 05/27/2020   TIBC 381 05/27/2020   IRONPCTSAT 9 (L) 05/27/2020   Lab Results  Component Value Date   FERRITIN 16 05/27/2020     STUDIES: MM 3D SCREEN BREAST BILATERAL  Result Date: 05/26/2020 CLINICAL DATA:  Screening. EXAM: DIGITAL SCREENING BILATERAL MAMMOGRAM WITH TOMO AND CAD COMPARISON:  Previous exam(s). ACR Breast Density Category b: There are scattered areas of fibroglandular density. FINDINGS: There are no findings suspicious for malignancy. Images were processed with CAD. IMPRESSION: No mammographic evidence of malignancy. A result letter of this screening mammogram will be mailed directly to the patient. RECOMMENDATION: Screening mammogram in one year. (Code:SM-B-01Y) BI-RADS CATEGORY  1: Negative.  Electronically Signed   By: Lillia Mountain M.D.   On: 05/26/2020 13:12    ASSESSMENT: Iron deficiency anemia.  PLAN:    1.  Iron deficiency anemia: Patient reports a normal colonoscopy and EGD several years ago, but we do not have these results in hand.  Initial treatment with oral iron supplementation did not appreciably increase her iron stores her hemoglobin.  Previously, the remainder of her laboratory work was either negative or within normal limits.  Patient's hemoglobin has trended down slightly to 10.8 and her iron stores are decreased as well.  She is currently asymptomatic and does not wish to pursue treatment today.  She likely will require treatment at her next clinic visit.  Return to clinic in 4 months with repeat laboratory work, further evaluation, and continuation of IV Feraheme.   I spent a total of 20 minutes reviewing chart data, face-to-face evaluation with the patient, counseling and coordination of care as detailed above.   Patient expressed understanding and was in agreement with this plan. She also understands that She can call clinic at any time  with any questions, concerns, or complaints.    Lloyd Huger, MD   05/29/2020 9:02 AM

## 2020-05-26 ENCOUNTER — Encounter: Payer: Self-pay | Admitting: Oncology

## 2020-05-26 DIAGNOSIS — E538 Deficiency of other specified B group vitamins: Secondary | ICD-10-CM | POA: Diagnosis not present

## 2020-05-26 DIAGNOSIS — D500 Iron deficiency anemia secondary to blood loss (chronic): Secondary | ICD-10-CM | POA: Diagnosis not present

## 2020-05-26 DIAGNOSIS — Z0000 Encounter for general adult medical examination without abnormal findings: Secondary | ICD-10-CM | POA: Diagnosis not present

## 2020-05-26 NOTE — Progress Notes (Signed)
Patient prescreened for appointment. Patient has no concerns or questions.  

## 2020-05-27 ENCOUNTER — Inpatient Hospital Stay: Payer: PPO

## 2020-05-27 ENCOUNTER — Encounter: Payer: Self-pay | Admitting: Oncology

## 2020-05-27 ENCOUNTER — Other Ambulatory Visit: Payer: Self-pay

## 2020-05-27 ENCOUNTER — Inpatient Hospital Stay (HOSPITAL_BASED_OUTPATIENT_CLINIC_OR_DEPARTMENT_OTHER): Payer: PPO | Admitting: Oncology

## 2020-05-27 ENCOUNTER — Inpatient Hospital Stay: Payer: PPO | Attending: Oncology

## 2020-05-27 VITALS — BP 136/72 | HR 81 | Temp 97.8°F | Resp 20 | Wt 157.9 lb

## 2020-05-27 DIAGNOSIS — D509 Iron deficiency anemia, unspecified: Secondary | ICD-10-CM

## 2020-05-27 LAB — CBC WITH DIFFERENTIAL/PLATELET
Abs Immature Granulocytes: 0.02 10*3/uL (ref 0.00–0.07)
Basophils Absolute: 0 10*3/uL (ref 0.0–0.1)
Basophils Relative: 1 %
Eosinophils Absolute: 0.1 10*3/uL (ref 0.0–0.5)
Eosinophils Relative: 1 %
HCT: 32.7 % — ABNORMAL LOW (ref 36.0–46.0)
Hemoglobin: 10.8 g/dL — ABNORMAL LOW (ref 12.0–15.0)
Immature Granulocytes: 0 %
Lymphocytes Relative: 26 %
Lymphs Abs: 1.5 10*3/uL (ref 0.7–4.0)
MCH: 29.2 pg (ref 26.0–34.0)
MCHC: 33 g/dL (ref 30.0–36.0)
MCV: 88.4 fL (ref 80.0–100.0)
Monocytes Absolute: 0.6 10*3/uL (ref 0.1–1.0)
Monocytes Relative: 11 %
Neutro Abs: 3.5 10*3/uL (ref 1.7–7.7)
Neutrophils Relative %: 61 %
Platelets: 206 10*3/uL (ref 150–400)
RBC: 3.7 MIL/uL — ABNORMAL LOW (ref 3.87–5.11)
RDW: 13.8 % (ref 11.5–15.5)
WBC: 5.6 10*3/uL (ref 4.0–10.5)
nRBC: 0 % (ref 0.0–0.2)

## 2020-05-27 LAB — FERRITIN: Ferritin: 16 ng/mL (ref 11–307)

## 2020-05-27 LAB — IRON AND TIBC
Iron: 34 ug/dL (ref 28–170)
Saturation Ratios: 9 % — ABNORMAL LOW (ref 10.4–31.8)
TIBC: 381 ug/dL (ref 250–450)
UIBC: 347 ug/dL

## 2020-06-10 DIAGNOSIS — H903 Sensorineural hearing loss, bilateral: Secondary | ICD-10-CM | POA: Diagnosis not present

## 2020-06-22 DIAGNOSIS — M955 Acquired deformity of pelvis: Secondary | ICD-10-CM | POA: Diagnosis not present

## 2020-06-22 DIAGNOSIS — M5136 Other intervertebral disc degeneration, lumbar region: Secondary | ICD-10-CM | POA: Diagnosis not present

## 2020-06-22 DIAGNOSIS — M9905 Segmental and somatic dysfunction of pelvic region: Secondary | ICD-10-CM | POA: Diagnosis not present

## 2020-06-22 DIAGNOSIS — M9903 Segmental and somatic dysfunction of lumbar region: Secondary | ICD-10-CM | POA: Diagnosis not present

## 2020-07-07 DIAGNOSIS — Z8371 Family history of colonic polyps: Secondary | ICD-10-CM | POA: Diagnosis not present

## 2020-07-07 DIAGNOSIS — D509 Iron deficiency anemia, unspecified: Secondary | ICD-10-CM | POA: Diagnosis not present

## 2020-07-07 DIAGNOSIS — E538 Deficiency of other specified B group vitamins: Secondary | ICD-10-CM | POA: Diagnosis not present

## 2020-07-07 DIAGNOSIS — Z01812 Encounter for preprocedural laboratory examination: Secondary | ICD-10-CM | POA: Diagnosis not present

## 2020-07-20 DIAGNOSIS — M955 Acquired deformity of pelvis: Secondary | ICD-10-CM | POA: Diagnosis not present

## 2020-07-20 DIAGNOSIS — M9903 Segmental and somatic dysfunction of lumbar region: Secondary | ICD-10-CM | POA: Diagnosis not present

## 2020-07-20 DIAGNOSIS — M5136 Other intervertebral disc degeneration, lumbar region: Secondary | ICD-10-CM | POA: Diagnosis not present

## 2020-07-20 DIAGNOSIS — M9905 Segmental and somatic dysfunction of pelvic region: Secondary | ICD-10-CM | POA: Diagnosis not present

## 2020-07-21 ENCOUNTER — Other Ambulatory Visit: Admission: RE | Admit: 2020-07-21 | Payer: PPO | Source: Ambulatory Visit

## 2020-07-23 ENCOUNTER — Ambulatory Visit: Admit: 2020-07-23 | Payer: PPO

## 2020-07-23 SURGERY — COLONOSCOPY WITH PROPOFOL
Anesthesia: General

## 2020-07-29 DIAGNOSIS — M9903 Segmental and somatic dysfunction of lumbar region: Secondary | ICD-10-CM | POA: Diagnosis not present

## 2020-07-29 DIAGNOSIS — M955 Acquired deformity of pelvis: Secondary | ICD-10-CM | POA: Diagnosis not present

## 2020-07-29 DIAGNOSIS — M5136 Other intervertebral disc degeneration, lumbar region: Secondary | ICD-10-CM | POA: Diagnosis not present

## 2020-07-29 DIAGNOSIS — M9905 Segmental and somatic dysfunction of pelvic region: Secondary | ICD-10-CM | POA: Diagnosis not present

## 2020-08-15 DIAGNOSIS — H43812 Vitreous degeneration, left eye: Secondary | ICD-10-CM | POA: Diagnosis not present

## 2020-08-17 DIAGNOSIS — M9903 Segmental and somatic dysfunction of lumbar region: Secondary | ICD-10-CM | POA: Diagnosis not present

## 2020-08-17 DIAGNOSIS — M9905 Segmental and somatic dysfunction of pelvic region: Secondary | ICD-10-CM | POA: Diagnosis not present

## 2020-08-17 DIAGNOSIS — M5136 Other intervertebral disc degeneration, lumbar region: Secondary | ICD-10-CM | POA: Diagnosis not present

## 2020-08-17 DIAGNOSIS — M955 Acquired deformity of pelvis: Secondary | ICD-10-CM | POA: Diagnosis not present

## 2020-09-13 DIAGNOSIS — M9903 Segmental and somatic dysfunction of lumbar region: Secondary | ICD-10-CM | POA: Diagnosis not present

## 2020-09-13 DIAGNOSIS — M955 Acquired deformity of pelvis: Secondary | ICD-10-CM | POA: Diagnosis not present

## 2020-09-13 DIAGNOSIS — M5136 Other intervertebral disc degeneration, lumbar region: Secondary | ICD-10-CM | POA: Diagnosis not present

## 2020-09-13 DIAGNOSIS — M9905 Segmental and somatic dysfunction of pelvic region: Secondary | ICD-10-CM | POA: Diagnosis not present

## 2020-09-15 DIAGNOSIS — M955 Acquired deformity of pelvis: Secondary | ICD-10-CM | POA: Diagnosis not present

## 2020-09-15 DIAGNOSIS — M5136 Other intervertebral disc degeneration, lumbar region: Secondary | ICD-10-CM | POA: Diagnosis not present

## 2020-09-15 DIAGNOSIS — M9905 Segmental and somatic dysfunction of pelvic region: Secondary | ICD-10-CM | POA: Diagnosis not present

## 2020-09-15 DIAGNOSIS — M9903 Segmental and somatic dysfunction of lumbar region: Secondary | ICD-10-CM | POA: Diagnosis not present

## 2020-09-22 DIAGNOSIS — M955 Acquired deformity of pelvis: Secondary | ICD-10-CM | POA: Diagnosis not present

## 2020-09-22 DIAGNOSIS — M9903 Segmental and somatic dysfunction of lumbar region: Secondary | ICD-10-CM | POA: Diagnosis not present

## 2020-09-22 DIAGNOSIS — M9905 Segmental and somatic dysfunction of pelvic region: Secondary | ICD-10-CM | POA: Diagnosis not present

## 2020-09-22 DIAGNOSIS — M5136 Other intervertebral disc degeneration, lumbar region: Secondary | ICD-10-CM | POA: Diagnosis not present

## 2020-09-24 NOTE — Progress Notes (Signed)
[place]  [phone] [phone]) [phone]  ID: Leonor Liv OB: [birth year]  MR#: [id number]  EPP#:[id number]  Patient Care Team: Rusty Aus, MD as PCP - General (Internal Medicine) Rusty Aus, MD (Internal Medicine) Bary Castilla Forest Gleason, MD (General Surgery) Lloyd Huger, MD as Consulting Physician (Oncology)  CHIEF COMPLAINT: Iron deficiency anemia.  INTERVAL HISTORY: Patient returns to clinic today for repeat laboratory work and further evaluation. She continues to feel well and remains asymptomatic.  She does not complain of any weakness or fatigue. She has no neurologic complaints.  She denies any recent fevers or illnesses.  She has a good appetite and denies weight loss.  She has no chest pain, shortness of breath, cough, or hemoptysis.  She has no nausea, vomiting, constipation, or diarrhea.  She has no melena or hematochezia.  She has no urinary complaints.  Patient feels at her baseline offers no specific complaints today.    REVIEW OF SYSTEMS:   Review of Systems  Constitutional: Negative.  Negative for fever, malaise/fatigue and weight loss.  Respiratory: Negative.  Negative for cough and shortness of breath.   Cardiovascular: Negative.  Negative for chest pain and leg swelling.  Gastrointestinal: Negative.  Negative for abdominal pain, blood in stool and melena.  Genitourinary: Negative.  Negative for hematuria.  Musculoskeletal: Negative.  Negative for back pain.  Skin: Negative.  Negative for rash.  Neurological: Negative.  Negative for dizziness, focal weakness, weakness and headaches.  Psychiatric/Behavioral: Negative.  The patient is not nervous/anxious.     As per HPI. Otherwise, a complete review of systems is negative.  PAST MEDICAL HISTORY: Past Medical History:  Diagnosis Date  . Complication of anesthesia   . Dysrhythmia    fast heart beat  . PONV (postoperative nausea and vomiting)    AFTER KNEE SURGERY    MAY  HAVE BEEN PAIN PILL    PAST SURGICAL HISTORY: Past Surgical History:  Procedure Laterality Date  . CATARACT EXTRACTION W/PHACO Left 05/23/2016   Procedure: CATARACT EXTRACTION PHACO AND INTRAOCULAR LENS PLACEMENT (IOC);  Surgeon: Leandrew Koyanagi, MD;  Location: [facility];  Service: Ophthalmology;  Laterality: Left;  [place]  01:13 AP% 17.3 CDE 12.65 fluid pack lot # [id number]  . CATARACT EXTRACTION W/PHACO Right 06/21/2016   Procedure: CATARACT EXTRACTION PHACO AND INTRAOCULAR LENS PLACEMENT (IOC) right eye;  Surgeon: Leandrew Koyanagi, MD;  Location: [place];  Service: Ophthalmology;  Laterality: Right;  . COLONOSCOPY  03/31/15  . FINGER SURGERY Right 2003   middle   . KNEE ARTHROSCOPY Left 2013  . ROTATOR CUFF REPAIR Left 11/20/2006    FAMILY HISTORY: Family History  Problem Relation Age of Onset  . Cancer Neg Hx   . Breast cancer Neg Hx     ADVANCED DIRECTIVES (Y/N):  N  HEALTH MAINTENANCE: Social History   Tobacco Use  . Smoking status: Never Smoker  . Smokeless tobacco: Never Used  Substance Use Topics  . Alcohol use: No    Alcohol/week: 0.0 standard drinks  . Drug use: No     Colonoscopy:  PAP:  Bone density:  Lipid panel:  Allergies  Allergen Reactions  . Poison Oak Extract Rash    Current Outpatient Medications  Medication Sig Dispense Refill  . Ascorbic Acid (VITAMIN C) 1000 MG tablet Take 1,000 mg by mouth daily.    Marland Kitchen aspirin 81 MG tablet Take 81 mg by mouth daily.    . iron polysaccharides (NIFEREX) 150 MG capsule Take 1  capsule by mouth 2 (two) times daily.    . Misc Natural Products (CALCIUM PLUS ADVANCED PO) Take by mouth 2 (two) times daily.    . Misc Natural Products (EQL GREEN TEA COMPLEX) TABS Take by mouth daily.    . Multiple Vitamins-Minerals (MULTIVITAMIN WITH MINERALS) tablet Take 1 tablet by mouth daily.    . Omega-3 Fatty Acids (FISH OIL) 1000 MG CAPS Take by mouth daily.    . vitamin B-12 (CYANOCOBALAMIN) 500 MCG tablet  Take 500 mcg by mouth daily.    . Vitamin D, Cholecalciferol, 1000 units TABS Take by mouth daily.    . Zinc 50 MG TABS Take by mouth daily.     No current facility-administered medications for this visit.    OBJECTIVE: Vitals:   09/27/20 1332  BP: 121/71  Pulse: 79  Resp: 20  Temp: 97.8 F (36.6 C)  SpO2: 100%     Body mass index is 28.47 kg/m.    ECOG FS:0 - Asymptomatic  General: Well-developed, well-nourished, no acute distress. Eyes: Pink conjunctiva, anicteric sclera. HEENT: Normocephalic, moist mucous membranes. Lungs: No audible wheezing or coughing. Heart: Regular rate and rhythm. Abdomen: Soft, nontender, no obvious distention. Musculoskeletal: No edema, cyanosis, or clubbing. Neuro: Alert, answering all questions appropriately. Cranial nerves grossly intact. Skin: No rashes or petechiae noted. Psych: Normal affect.  LAB RESULTS:   Lab Results  Component Value Date   WBC 5.3 09/27/2020   NEUTROABS 3.1 09/27/2020   HGB 11.6 (L) 09/27/2020   HCT 34.3 (L) 09/27/2020   MCV 89.8 09/27/2020   PLT 169 09/27/2020   Lab Results  Component Value Date   IRON 101 09/27/2020   TIBC 332 09/27/2020   IRONPCTSAT 30 09/27/2020   Lab Results  Component Value Date   FERRITIN 26 09/27/2020     STUDIES: No results found.  ASSESSMENT: Iron deficiency anemia.  PLAN:    1.  Iron deficiency anemia: Patient reports a normal colonoscopy and EGD several years ago, but we do not have these results in hand.  She has a repeat colonoscopy and EGD scheduled prior to the end of the year.  Treatment with oral iron supplementation did not appreciably increase her iron stores her hemoglobin.  Previously, the remainder of her laboratory work was either negative or within normal limits.  Patient's hemoglobin and iron stores have trended up and she remains asymptomatic.  She does not require IV Feraheme today.  Patient last received treatment on February 01, 2019.  Return to clinic in  4 months with repeat laboratory work, further evaluation, and consideration of additional Feraheme if needed.  If patient's hemoglobin and iron stores remain adequate at that time, she likely can be discharged from clinic.    I spent a total of 20 minutes reviewing chart data, face-to-face evaluation with the patient, counseling and coordination of care as detailed above.   Patient expressed understanding and was in agreement with this plan. She also understands that She can call clinic at any time with any questions, concerns, or complaints.    Lloyd Huger, MD   09/28/2020 6:40 AM

## 2020-09-27 ENCOUNTER — Other Ambulatory Visit: Payer: Self-pay

## 2020-09-27 ENCOUNTER — Inpatient Hospital Stay: Payer: PPO | Attending: Oncology

## 2020-09-27 ENCOUNTER — Inpatient Hospital Stay (HOSPITAL_BASED_OUTPATIENT_CLINIC_OR_DEPARTMENT_OTHER): Payer: PPO | Admitting: Oncology

## 2020-09-27 ENCOUNTER — Encounter: Payer: Self-pay | Admitting: Oncology

## 2020-09-27 ENCOUNTER — Inpatient Hospital Stay: Payer: PPO

## 2020-09-27 VITALS — BP 121/71 | HR 79 | Temp 97.8°F | Resp 20 | Wt 160.7 lb

## 2020-09-27 DIAGNOSIS — D500 Iron deficiency anemia secondary to blood loss (chronic): Secondary | ICD-10-CM | POA: Diagnosis not present

## 2020-09-27 DIAGNOSIS — D509 Iron deficiency anemia, unspecified: Secondary | ICD-10-CM | POA: Diagnosis not present

## 2020-09-27 LAB — IRON AND TIBC
Iron: 101 ug/dL (ref 28–170)
Saturation Ratios: 30 % (ref 10.4–31.8)
TIBC: 332 ug/dL (ref 250–450)
UIBC: 231 ug/dL

## 2020-09-27 LAB — CBC WITH DIFFERENTIAL/PLATELET
Abs Immature Granulocytes: 0.01 10*3/uL (ref 0.00–0.07)
Basophils Absolute: 0 10*3/uL (ref 0.0–0.1)
Basophils Relative: 1 %
Eosinophils Absolute: 0.1 10*3/uL (ref 0.0–0.5)
Eosinophils Relative: 2 %
HCT: 34.3 % — ABNORMAL LOW (ref 36.0–46.0)
Hemoglobin: 11.6 g/dL — ABNORMAL LOW (ref 12.0–15.0)
Immature Granulocytes: 0 %
Lymphocytes Relative: 29 %
Lymphs Abs: 1.5 10*3/uL (ref 0.7–4.0)
MCH: 30.4 pg (ref 26.0–34.0)
MCHC: 33.8 g/dL (ref 30.0–36.0)
MCV: 89.8 fL (ref 80.0–100.0)
Monocytes Absolute: 0.5 10*3/uL (ref 0.1–1.0)
Monocytes Relative: 10 %
Neutro Abs: 3.1 10*3/uL (ref 1.7–7.7)
Neutrophils Relative %: 58 %
Platelets: 169 10*3/uL (ref 150–400)
RBC: 3.82 MIL/uL — ABNORMAL LOW (ref 3.87–5.11)
RDW: 14.3 % (ref 11.5–15.5)
WBC: 5.3 10*3/uL (ref 4.0–10.5)
nRBC: 0 % (ref 0.0–0.2)

## 2020-09-27 LAB — FERRITIN: Ferritin: 26 ng/mL (ref 11–307)

## 2020-10-11 DIAGNOSIS — H40003 Preglaucoma, unspecified, bilateral: Secondary | ICD-10-CM | POA: Diagnosis not present

## 2020-10-18 DIAGNOSIS — M5136 Other intervertebral disc degeneration, lumbar region: Secondary | ICD-10-CM | POA: Diagnosis not present

## 2020-10-18 DIAGNOSIS — M9903 Segmental and somatic dysfunction of lumbar region: Secondary | ICD-10-CM | POA: Diagnosis not present

## 2020-10-18 DIAGNOSIS — M955 Acquired deformity of pelvis: Secondary | ICD-10-CM | POA: Diagnosis not present

## 2020-10-18 DIAGNOSIS — M9905 Segmental and somatic dysfunction of pelvic region: Secondary | ICD-10-CM | POA: Diagnosis not present

## 2020-10-19 DIAGNOSIS — M9903 Segmental and somatic dysfunction of lumbar region: Secondary | ICD-10-CM | POA: Diagnosis not present

## 2020-10-19 DIAGNOSIS — M5136 Other intervertebral disc degeneration, lumbar region: Secondary | ICD-10-CM | POA: Diagnosis not present

## 2020-10-19 DIAGNOSIS — M955 Acquired deformity of pelvis: Secondary | ICD-10-CM | POA: Diagnosis not present

## 2020-10-19 DIAGNOSIS — M9905 Segmental and somatic dysfunction of pelvic region: Secondary | ICD-10-CM | POA: Diagnosis not present

## 2020-10-20 DIAGNOSIS — M9905 Segmental and somatic dysfunction of pelvic region: Secondary | ICD-10-CM | POA: Diagnosis not present

## 2020-10-20 DIAGNOSIS — M5136 Other intervertebral disc degeneration, lumbar region: Secondary | ICD-10-CM | POA: Diagnosis not present

## 2020-10-20 DIAGNOSIS — M955 Acquired deformity of pelvis: Secondary | ICD-10-CM | POA: Diagnosis not present

## 2020-10-20 DIAGNOSIS — M9903 Segmental and somatic dysfunction of lumbar region: Secondary | ICD-10-CM | POA: Diagnosis not present

## 2020-10-26 DIAGNOSIS — M9903 Segmental and somatic dysfunction of lumbar region: Secondary | ICD-10-CM | POA: Diagnosis not present

## 2020-10-26 DIAGNOSIS — M9905 Segmental and somatic dysfunction of pelvic region: Secondary | ICD-10-CM | POA: Diagnosis not present

## 2020-10-26 DIAGNOSIS — M955 Acquired deformity of pelvis: Secondary | ICD-10-CM | POA: Diagnosis not present

## 2020-10-26 DIAGNOSIS — M5136 Other intervertebral disc degeneration, lumbar region: Secondary | ICD-10-CM | POA: Diagnosis not present

## 2020-11-10 DIAGNOSIS — M955 Acquired deformity of pelvis: Secondary | ICD-10-CM | POA: Diagnosis not present

## 2020-11-10 DIAGNOSIS — M9903 Segmental and somatic dysfunction of lumbar region: Secondary | ICD-10-CM | POA: Diagnosis not present

## 2020-11-10 DIAGNOSIS — M5136 Other intervertebral disc degeneration, lumbar region: Secondary | ICD-10-CM | POA: Diagnosis not present

## 2020-11-10 DIAGNOSIS — M9905 Segmental and somatic dysfunction of pelvic region: Secondary | ICD-10-CM | POA: Diagnosis not present

## 2020-11-15 DIAGNOSIS — M545 Low back pain, unspecified: Secondary | ICD-10-CM | POA: Diagnosis not present

## 2020-11-17 DIAGNOSIS — M9903 Segmental and somatic dysfunction of lumbar region: Secondary | ICD-10-CM | POA: Diagnosis not present

## 2020-11-17 DIAGNOSIS — M5136 Other intervertebral disc degeneration, lumbar region: Secondary | ICD-10-CM | POA: Diagnosis not present

## 2020-11-17 DIAGNOSIS — M955 Acquired deformity of pelvis: Secondary | ICD-10-CM | POA: Diagnosis not present

## 2020-11-17 DIAGNOSIS — M9905 Segmental and somatic dysfunction of pelvic region: Secondary | ICD-10-CM | POA: Diagnosis not present

## 2020-11-18 DIAGNOSIS — E538 Deficiency of other specified B group vitamins: Secondary | ICD-10-CM | POA: Diagnosis not present

## 2020-11-18 DIAGNOSIS — D500 Iron deficiency anemia secondary to blood loss (chronic): Secondary | ICD-10-CM | POA: Diagnosis not present

## 2020-12-06 DIAGNOSIS — D500 Iron deficiency anemia secondary to blood loss (chronic): Secondary | ICD-10-CM | POA: Diagnosis not present

## 2020-12-06 DIAGNOSIS — Z79899 Other long term (current) drug therapy: Secondary | ICD-10-CM | POA: Diagnosis not present

## 2020-12-06 DIAGNOSIS — E538 Deficiency of other specified B group vitamins: Secondary | ICD-10-CM | POA: Diagnosis not present

## 2020-12-06 DIAGNOSIS — Z8616 Personal history of COVID-19: Secondary | ICD-10-CM | POA: Diagnosis not present

## 2020-12-08 DIAGNOSIS — M545 Low back pain, unspecified: Secondary | ICD-10-CM | POA: Diagnosis not present

## 2020-12-14 DIAGNOSIS — M545 Low back pain, unspecified: Secondary | ICD-10-CM | POA: Diagnosis not present

## 2020-12-17 DIAGNOSIS — D225 Melanocytic nevi of trunk: Secondary | ICD-10-CM | POA: Diagnosis not present

## 2020-12-17 DIAGNOSIS — L821 Other seborrheic keratosis: Secondary | ICD-10-CM | POA: Diagnosis not present

## 2020-12-17 DIAGNOSIS — D2272 Melanocytic nevi of left lower limb, including hip: Secondary | ICD-10-CM | POA: Diagnosis not present

## 2020-12-17 DIAGNOSIS — D2261 Melanocytic nevi of right upper limb, including shoulder: Secondary | ICD-10-CM | POA: Diagnosis not present

## 2020-12-17 DIAGNOSIS — D2262 Melanocytic nevi of left upper limb, including shoulder: Secondary | ICD-10-CM | POA: Diagnosis not present

## 2020-12-17 DIAGNOSIS — D2271 Melanocytic nevi of right lower limb, including hip: Secondary | ICD-10-CM | POA: Diagnosis not present

## 2020-12-21 DIAGNOSIS — M5416 Radiculopathy, lumbar region: Secondary | ICD-10-CM | POA: Diagnosis not present

## 2021-01-06 DIAGNOSIS — M5416 Radiculopathy, lumbar region: Secondary | ICD-10-CM | POA: Diagnosis not present

## 2021-01-11 DIAGNOSIS — M5416 Radiculopathy, lumbar region: Secondary | ICD-10-CM | POA: Diagnosis not present

## 2021-01-20 DIAGNOSIS — Z20828 Contact with and (suspected) exposure to other viral communicable diseases: Secondary | ICD-10-CM | POA: Diagnosis not present

## 2021-01-25 DIAGNOSIS — M5136 Other intervertebral disc degeneration, lumbar region: Secondary | ICD-10-CM | POA: Diagnosis not present

## 2021-01-25 DIAGNOSIS — M9903 Segmental and somatic dysfunction of lumbar region: Secondary | ICD-10-CM | POA: Diagnosis not present

## 2021-01-25 DIAGNOSIS — M9905 Segmental and somatic dysfunction of pelvic region: Secondary | ICD-10-CM | POA: Diagnosis not present

## 2021-01-25 DIAGNOSIS — M955 Acquired deformity of pelvis: Secondary | ICD-10-CM | POA: Diagnosis not present

## 2021-01-28 DIAGNOSIS — M5416 Radiculopathy, lumbar region: Secondary | ICD-10-CM | POA: Diagnosis not present

## 2021-02-07 ENCOUNTER — Ambulatory Visit: Payer: PPO

## 2021-02-07 ENCOUNTER — Inpatient Hospital Stay: Payer: PPO

## 2021-02-07 ENCOUNTER — Other Ambulatory Visit: Payer: PPO

## 2021-02-07 ENCOUNTER — Encounter: Payer: Self-pay | Admitting: Oncology

## 2021-02-07 ENCOUNTER — Other Ambulatory Visit: Payer: Self-pay

## 2021-02-07 ENCOUNTER — Ambulatory Visit: Payer: PPO | Admitting: Oncology

## 2021-02-07 ENCOUNTER — Inpatient Hospital Stay: Payer: PPO | Attending: Oncology | Admitting: Oncology

## 2021-02-07 VITALS — BP 131/72 | HR 75 | Temp 97.8°F | Resp 16 | Ht 63.0 in | Wt 163.0 lb

## 2021-02-07 DIAGNOSIS — D500 Iron deficiency anemia secondary to blood loss (chronic): Secondary | ICD-10-CM

## 2021-02-07 DIAGNOSIS — D509 Iron deficiency anemia, unspecified: Secondary | ICD-10-CM | POA: Insufficient documentation

## 2021-02-07 LAB — COMPREHENSIVE METABOLIC PANEL
ALT: 25 U/L (ref 0–44)
AST: 23 U/L (ref 15–41)
Albumin: 3.6 g/dL (ref 3.5–5.0)
Alkaline Phosphatase: 60 U/L (ref 38–126)
Anion gap: 7 (ref 5–15)
BUN: 16 mg/dL (ref 8–23)
CO2: 26 mmol/L (ref 22–32)
Calcium: 8.6 mg/dL — ABNORMAL LOW (ref 8.9–10.3)
Chloride: 107 mmol/L (ref 98–111)
Creatinine, Ser: 0.59 mg/dL (ref 0.44–1.00)
GFR, Estimated: >60 mL/min (ref >60)
Glucose, Bld: 98 mg/dL (ref 70–99)
Potassium: 4.3 mmol/L (ref 3.5–5.1)
Sodium: 140 mmol/L (ref 135–145)
Total Bilirubin: 0.7 mg/dL (ref 0.3–1.2)
Total Protein: 6.6 g/dL (ref 6.5–8.1)

## 2021-02-07 LAB — IRON AND TIBC
Iron: 104 ug/dL (ref 28–170)
Saturation Ratios: 29 % (ref 10.4–31.8)
TIBC: 358 ug/dL (ref 250–450)
UIBC: 254 ug/dL

## 2021-02-07 LAB — CBC WITH DIFFERENTIAL/PLATELET
Abs Immature Granulocytes: 0.01 10*3/uL (ref 0.00–0.07)
Basophils Absolute: 0 10*3/uL (ref 0.0–0.1)
Basophils Relative: 1 %
Eosinophils Absolute: 0.1 10*3/uL (ref 0.0–0.5)
Eosinophils Relative: 2 %
HCT: 36.6 % (ref 36.0–46.0)
Hemoglobin: 11.8 g/dL — ABNORMAL LOW (ref 12.0–15.0)
Immature Granulocytes: 0 %
Lymphocytes Relative: 22 %
Lymphs Abs: 1.3 10*3/uL (ref 0.7–4.0)
MCH: 29.6 pg (ref 26.0–34.0)
MCHC: 32.2 g/dL (ref 30.0–36.0)
MCV: 92 fL (ref 80.0–100.0)
Monocytes Absolute: 0.5 10*3/uL (ref 0.1–1.0)
Monocytes Relative: 9 %
Neutro Abs: 3.7 10*3/uL (ref 1.7–7.7)
Neutrophils Relative %: 66 %
Platelets: 199 10*3/uL (ref 150–400)
RBC: 3.98 MIL/uL (ref 3.87–5.11)
RDW: 15.3 % (ref 11.5–15.5)
WBC: 5.6 10*3/uL (ref 4.0–10.5)
nRBC: 0 % (ref 0.0–0.2)

## 2021-02-07 LAB — FERRITIN: Ferritin: 17 ng/mL (ref 11–307)

## 2021-02-07 NOTE — Progress Notes (Signed)
Middle of Jan and beginning of feb had epidural shots for scoliosis

## 2021-02-07 NOTE — Progress Notes (Signed)
[place]  Telephone:(336) [phone] [phone]) [phone]  ID: Becky Lam OB: 09/10/1946  MR#: [id number]  [id number]#:[id number]  Patient Care Team: Rusty Aus, MD as PCP - General (Internal Medicine) Rusty Aus, MD (Internal Medicine) Bary Castilla Forest Gleason, MD (General Surgery) Lloyd Huger, MD as Consulting Physician (Oncology)  CHIEF COMPLAINT: Iron deficiency anemia.  INTERVAL HISTORY: Patient returns to clinic today for repeat laboratory work and further evaluation.  She was last seen in clinic on 09/27/2020.  She last received IV Feraheme on 11/26/2019 and 12/02/2019.  In the interim, she has done well.  She denies any weakness or fatigue, shortness of breath, diarrhea or constipation.  Denies any melena or hematochezia.  She did get in touch with GI and has an appointment scheduled for April 2022 with Dr. Cornelia Copa.  She will have a colonoscopy/EGD in the near future.  She denies any recent fevers or illnesses.  She has a good appetite and denies weight loss.    REVIEW OF SYSTEMS:   Review of Systems  Constitutional: Negative.  Negative for fever, malaise/fatigue and weight loss.  Respiratory: Negative.  Negative for cough and shortness of breath.   Cardiovascular: Negative.  Negative for chest pain and leg swelling.  Gastrointestinal: Negative.  Negative for abdominal pain, blood in stool and melena.  Genitourinary: Negative.  Negative for hematuria.  Musculoskeletal: Negative.  Negative for back pain.  Skin: Negative.  Negative for rash.  Neurological: Negative.  Negative for dizziness, focal weakness, weakness and headaches.  Psychiatric/Behavioral: Negative.  The patient is not nervous/anxious.     As per HPI. Otherwise, a complete review of systems is negative.  PAST MEDICAL HISTORY: Past Medical History:  Diagnosis Date  . Complication of anesthesia   . Dysrhythmia    fast heart beat  . PONV (postoperative nausea and vomiting)    AFTER KNEE  SURGERY    MAY HAVE BEEN PAIN PILL    PAST SURGICAL HISTORY: Past Surgical History:  Procedure Laterality Date  . CATARACT EXTRACTION W/PHACO Left 05/23/2016   Procedure: CATARACT EXTRACTION PHACO AND INTRAOCULAR LENS PLACEMENT (IOC);  Surgeon: Leandrew Koyanagi, MD;  Location: [facility];  Service: Ophthalmology;  Laterality: Left;  [place]  01:13 AP% 17.3 CDE 12.65 fluid pack lot # [id number]  . CATARACT EXTRACTION W/PHACO Right 06/21/2016   Procedure: CATARACT EXTRACTION PHACO AND INTRAOCULAR LENS PLACEMENT (IOC) right eye;  Surgeon: Leandrew Koyanagi, MD;  Location: [place];  Service: Ophthalmology;  Laterality: Right;  . COLONOSCOPY  03/31/15  . FINGER SURGERY Right 2003   middle   . KNEE ARTHROSCOPY Left 2013  . ROTATOR CUFF REPAIR Left 11/20/2006    FAMILY HISTORY: Family History  Problem Relation Age of Onset  . Cancer Neg Hx   . Breast cancer Neg Hx     ADVANCED DIRECTIVES (Y/N):  N  HEALTH MAINTENANCE: Social History   Tobacco Use  . Smoking status: Never Smoker  . Smokeless tobacco: Never Used  Substance Use Topics  . Alcohol use: No    Alcohol/week: 0.0 standard drinks  . Drug use: No     Colonoscopy:  PAP:  Bone density:  Lipid panel:  Allergies  Allergen Reactions  . Poison Oak Extract Rash    Current Outpatient Medications  Medication Sig Dispense Refill  . Ascorbic Acid (VITAMIN C) 1000 MG tablet Take 1,000 mg by mouth daily.    Marland Kitchen aspirin 81 MG tablet Take 81 mg by mouth daily.    Marland Kitchen iron  polysaccharides (NIFEREX) 150 MG capsule Take 1 capsule by mouth 2 (two) times daily.    . Misc Natural Products (CALCIUM PLUS ADVANCED PO) Take by mouth 2 (two) times daily.    . Misc Natural Products (EQL GREEN TEA COMPLEX) TABS Take by mouth daily.    . Multiple Vitamins-Minerals (MULTIVITAMIN WITH MINERALS) tablet Take 1 tablet by mouth daily.    . Omega-3 Fatty Acids (FISH OIL) 1000 MG CAPS Take by mouth daily.    . vitamin B-12 (CYANOCOBALAMIN) 500  MCG tablet Take 500 mcg by mouth daily.    . Vitamin D, Cholecalciferol, 1000 units TABS Take by mouth daily.    . Zinc 50 MG TABS Take by mouth daily.     No current facility-administered medications for this visit.    OBJECTIVE: There were no vitals filed for this visit.   There is no height or weight on file to calculate BMI.    ECOG FS:0 - Asymptomatic  Physical Exam Constitutional:      General: Vital signs are normal.     Appearance: Normal appearance.  HENT:     Head: Normocephalic and atraumatic.  Eyes:     Pupils: Pupils are equal, round, and reactive to light.  Cardiovascular:     Rate and Rhythm: Normal rate and regular rhythm.     Heart sounds: Normal heart sounds. No murmur heard.   Pulmonary:     Effort: Pulmonary effort is normal.     Breath sounds: Normal breath sounds. No wheezing.  Abdominal:     General: Bowel sounds are normal. There is no distension.     Palpations: Abdomen is soft.     Tenderness: There is no abdominal tenderness.  Musculoskeletal:        General: No edema. Normal range of motion.     Cervical back: Normal range of motion.  Skin:    General: Skin is warm and dry.     Findings: No rash.  Neurological:     Mental Status: She is alert and oriented to person, place, and time.  Psychiatric:        Judgment: Judgment normal.     LAB RESULTS:   Lab Results  Component Value Date   WBC 5.3 09/27/2020   NEUTROABS 3.1 09/27/2020   HGB 11.6 (L) 09/27/2020   HCT 34.3 (L) 09/27/2020   MCV 89.8 09/27/2020   PLT 169 09/27/2020   Lab Results  Component Value Date   IRON 101 09/27/2020   TIBC 332 09/27/2020   IRONPCTSAT 30 09/27/2020   Lab Results  Component Value Date   FERRITIN 26 09/27/2020     STUDIES: No results found.  ASSESSMENT: Iron deficiency anemia.  PLAN:    1.  Iron deficiency anemia:  -Reports a normal colonoscopy/EGD several years ago. -Oral iron did not improve iron level significantly so was  discontinued. -She last received IV Feraheme on 11/26/2019 and 12/02/2019. -Labs from today show hemoglobin of 11.8.  Iron levels are pending. -Given she is feeling well, no iron needed today. -We will call patient with results of iron labs. -If they are extremely low, we will get her scheduled for additional IV iron. -She does have follow-up with Dr. Cornelia Copa (GI) in April 2022 for consult for colonoscopy/EGD.   Disposition: -No iron today. -RTC in 4-6 months if not sooner for repeat labs (CBC, CMP, ferritin, iron), MD assessment plus or minus IV Feraheme. -Patient would like to push out her appointment secondary to GI appointment.  Greater than 50% was spent in counseling and coordination of care with this patient including but not limited to discussion of the relevant topics above (See A&P) including, but not limited to diagnosis and management of acute and chronic medical conditions.   Patient expressed understanding and was in agreement with this plan. She also understands that She can call clinic at any time with any questions, concerns, or complaints.    Jacquelin Hawking, NP   02/07/2021 9:55 AM

## 2021-02-08 ENCOUNTER — Telehealth: Payer: Self-pay | Admitting: Oncology

## 2021-02-08 NOTE — Telephone Encounter (Signed)
Re: Labs  Called to discuss labs with patient.   Ferritin trended down from 26-17. Hemoglobin stable 11.8. She is not symptomatic.   Offered additional iron if she is interested prior to GI appt.   Left VM.   Faythe Casa, NP 02/08/2021 9:45 AM

## 2021-02-22 DIAGNOSIS — M9903 Segmental and somatic dysfunction of lumbar region: Secondary | ICD-10-CM | POA: Diagnosis not present

## 2021-02-22 DIAGNOSIS — M9905 Segmental and somatic dysfunction of pelvic region: Secondary | ICD-10-CM | POA: Diagnosis not present

## 2021-02-22 DIAGNOSIS — M955 Acquired deformity of pelvis: Secondary | ICD-10-CM | POA: Diagnosis not present

## 2021-02-22 DIAGNOSIS — M5136 Other intervertebral disc degeneration, lumbar region: Secondary | ICD-10-CM | POA: Diagnosis not present

## 2021-03-31 DIAGNOSIS — D509 Iron deficiency anemia, unspecified: Secondary | ICD-10-CM | POA: Diagnosis not present

## 2021-03-31 DIAGNOSIS — Z8371 Family history of colonic polyps: Secondary | ICD-10-CM | POA: Diagnosis not present

## 2021-03-31 IMAGING — MG DIGITAL SCREENING BILAT W/ TOMO W/ CAD
8 series · 8 of 24 positions shown · non-contrast
Comparison: Previous exam(s).

CLINICAL DATA: Screening.

EXAM:
DIGITAL SCREENING BILATERAL MAMMOGRAM WITH TOMO AND CAD

[L CC synth-2D]
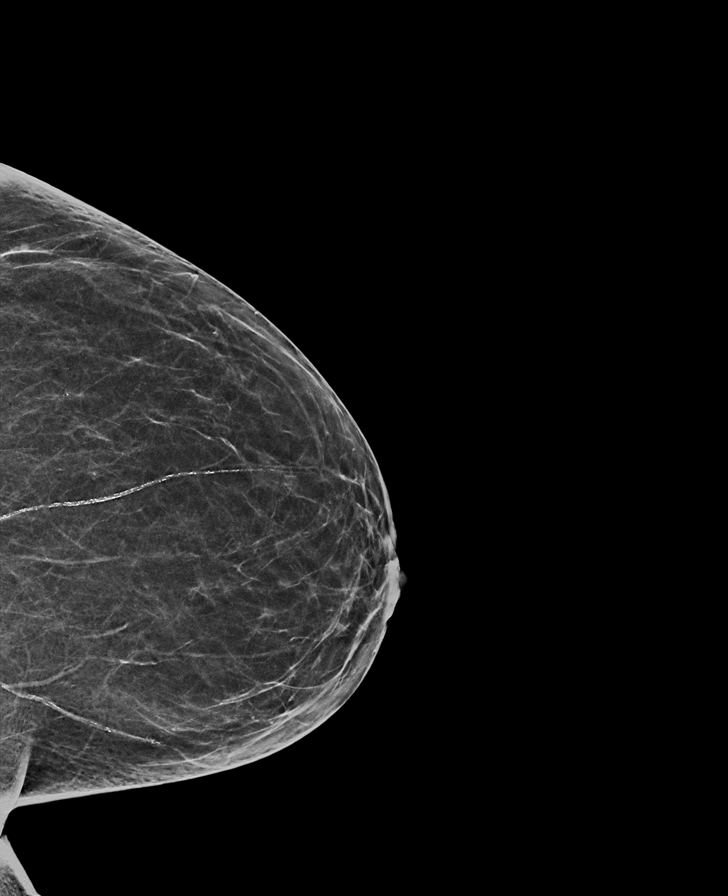

[R CC synth-2D]
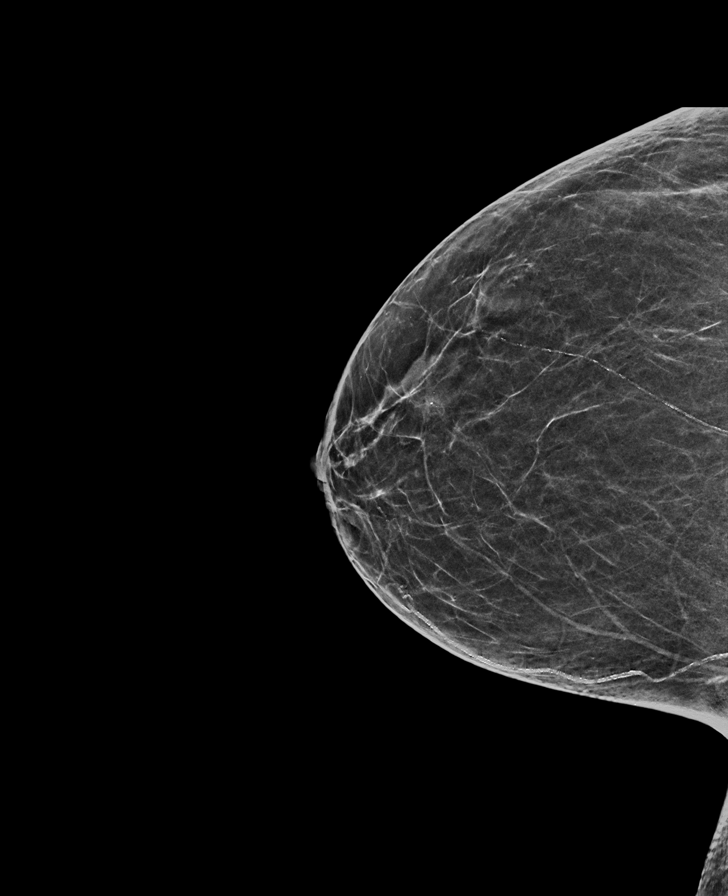

[R MLO synth-2D]
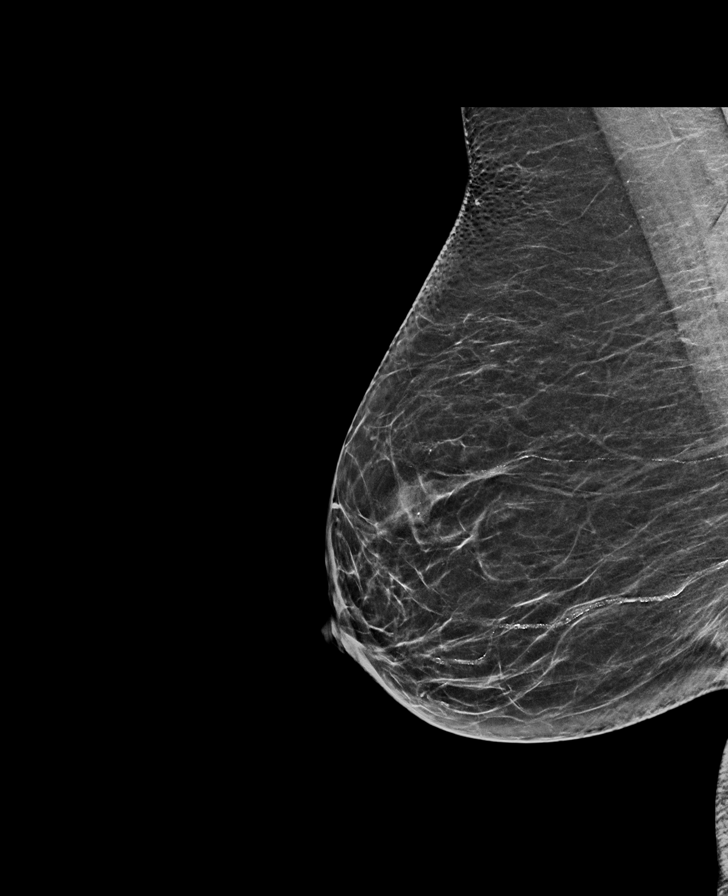

[L MLO synth-2D]
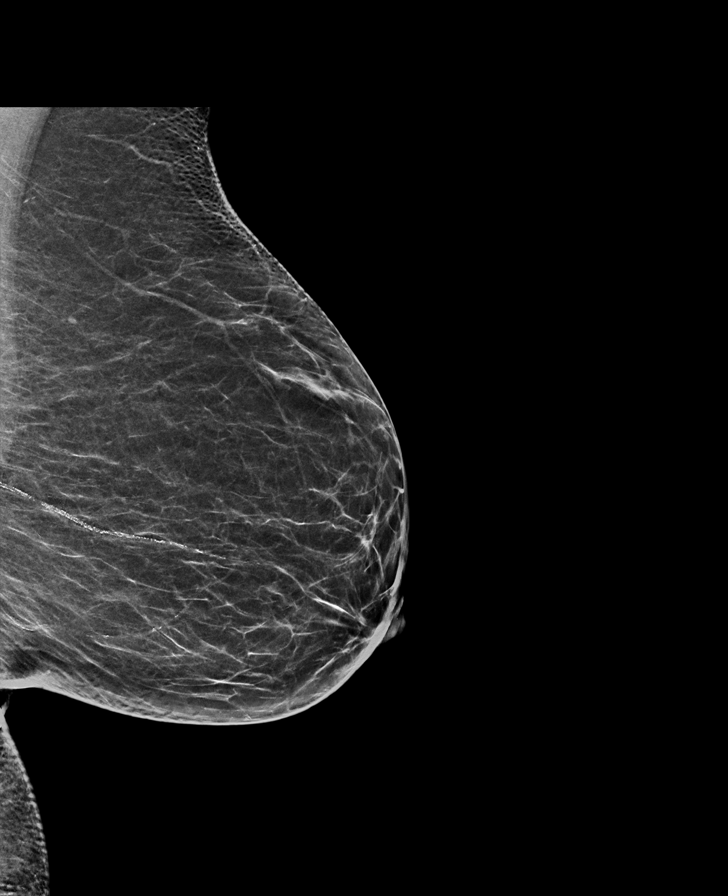

[R MLO tomo · tomo slice 28/55.0]
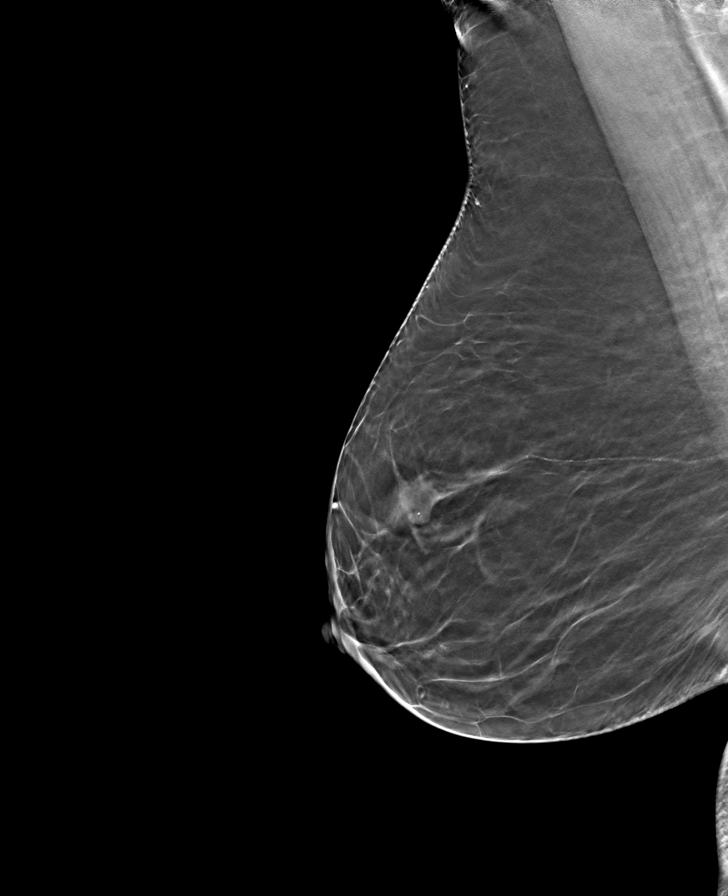

[R CC tomo · tomo slice 26/51.0]
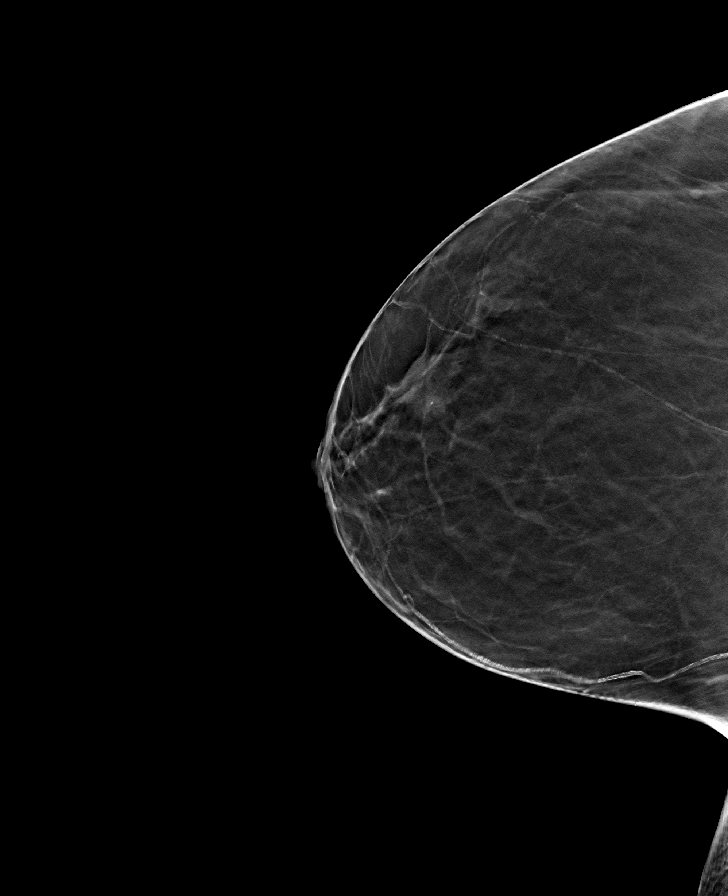

[L CC tomo · tomo slice 25/50.0]
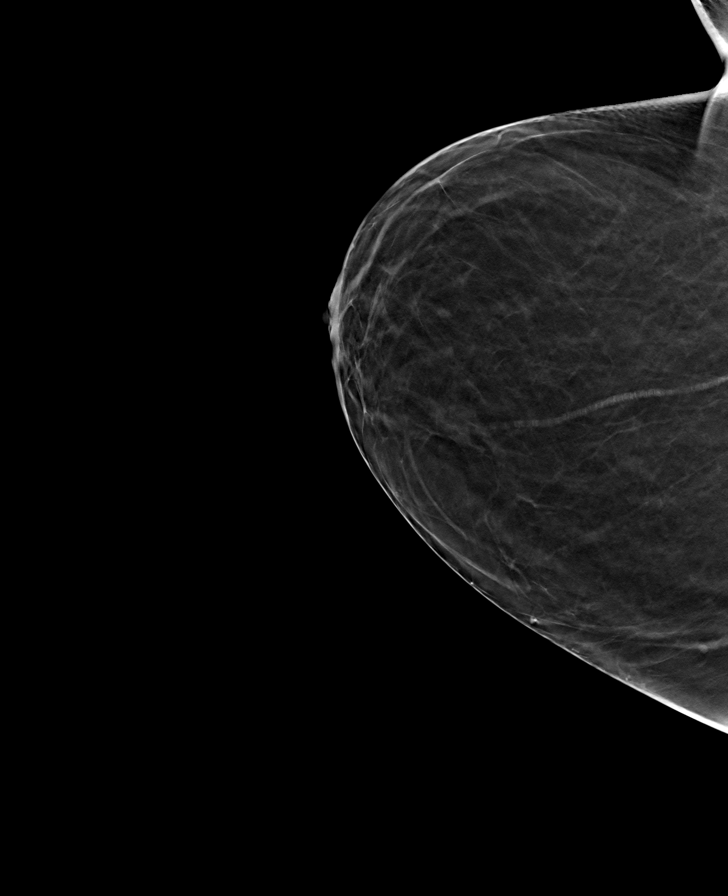

[L MLO tomo · tomo slice 28/55.0]
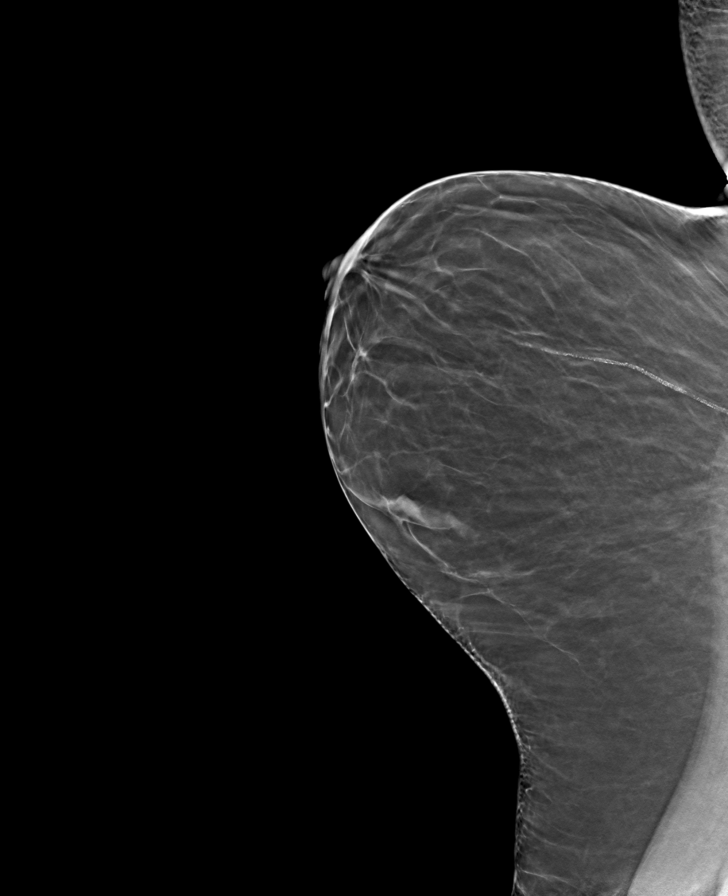

[8 of 24 positions shown; findings below may reference images not displayed]

ACR Breast Density Category b: There are scattered areas of
fibroglandular density.
FINDINGS: There are no findings suspicious for malignancy. Images were
processed with CAD.
IMPRESSION: No mammographic evidence of malignancy. A result letter of this
screening mammogram will be mailed directly to the patient.

RECOMMENDATION:
Screening mammogram in one year. (Code:[id number])

BI-RADS CATEGORY  1: Negative.

## 2021-04-11 DIAGNOSIS — H401131 Primary open-angle glaucoma, bilateral, mild stage: Secondary | ICD-10-CM | POA: Diagnosis not present

## 2021-04-25 DIAGNOSIS — M9905 Segmental and somatic dysfunction of pelvic region: Secondary | ICD-10-CM | POA: Diagnosis not present

## 2021-04-25 DIAGNOSIS — M9903 Segmental and somatic dysfunction of lumbar region: Secondary | ICD-10-CM | POA: Diagnosis not present

## 2021-04-25 DIAGNOSIS — M955 Acquired deformity of pelvis: Secondary | ICD-10-CM | POA: Diagnosis not present

## 2021-04-25 DIAGNOSIS — M5136 Other intervertebral disc degeneration, lumbar region: Secondary | ICD-10-CM | POA: Diagnosis not present

## 2021-05-20 DIAGNOSIS — Z961 Presence of intraocular lens: Secondary | ICD-10-CM | POA: Diagnosis not present

## 2021-05-20 DIAGNOSIS — H40003 Preglaucoma, unspecified, bilateral: Secondary | ICD-10-CM | POA: Diagnosis not present

## 2021-05-27 DIAGNOSIS — Z8371 Family history of colonic polyps: Secondary | ICD-10-CM | POA: Diagnosis not present

## 2021-05-27 DIAGNOSIS — K5730 Diverticulosis of large intestine without perforation or abscess without bleeding: Secondary | ICD-10-CM | POA: Diagnosis not present

## 2021-05-27 DIAGNOSIS — K640 First degree hemorrhoids: Secondary | ICD-10-CM | POA: Diagnosis not present

## 2021-05-27 DIAGNOSIS — Z1211 Encounter for screening for malignant neoplasm of colon: Secondary | ICD-10-CM | POA: Diagnosis not present

## 2021-06-07 DIAGNOSIS — E538 Deficiency of other specified B group vitamins: Secondary | ICD-10-CM | POA: Diagnosis not present

## 2021-06-07 DIAGNOSIS — D500 Iron deficiency anemia secondary to blood loss (chronic): Secondary | ICD-10-CM | POA: Diagnosis not present

## 2021-06-07 DIAGNOSIS — Z79899 Other long term (current) drug therapy: Secondary | ICD-10-CM | POA: Diagnosis not present

## 2021-06-23 DIAGNOSIS — Z0000 Encounter for general adult medical examination without abnormal findings: Secondary | ICD-10-CM | POA: Diagnosis not present

## 2021-06-23 DIAGNOSIS — E538 Deficiency of other specified B group vitamins: Secondary | ICD-10-CM | POA: Diagnosis not present

## 2021-06-23 DIAGNOSIS — Z79899 Other long term (current) drug therapy: Secondary | ICD-10-CM | POA: Diagnosis not present

## 2021-06-23 DIAGNOSIS — D500 Iron deficiency anemia secondary to blood loss (chronic): Secondary | ICD-10-CM | POA: Diagnosis not present

## 2021-06-23 DIAGNOSIS — R739 Hyperglycemia, unspecified: Secondary | ICD-10-CM | POA: Diagnosis not present

## 2021-06-27 ENCOUNTER — Inpatient Hospital Stay: Payer: PPO

## 2021-06-27 ENCOUNTER — Inpatient Hospital Stay: Payer: PPO | Admitting: Oncology

## 2021-07-25 DIAGNOSIS — M9903 Segmental and somatic dysfunction of lumbar region: Secondary | ICD-10-CM | POA: Diagnosis not present

## 2021-07-25 DIAGNOSIS — M955 Acquired deformity of pelvis: Secondary | ICD-10-CM | POA: Diagnosis not present

## 2021-07-25 DIAGNOSIS — M5136 Other intervertebral disc degeneration, lumbar region: Secondary | ICD-10-CM | POA: Diagnosis not present

## 2021-07-25 DIAGNOSIS — M9905 Segmental and somatic dysfunction of pelvic region: Secondary | ICD-10-CM | POA: Diagnosis not present

## 2021-08-09 DIAGNOSIS — M9903 Segmental and somatic dysfunction of lumbar region: Secondary | ICD-10-CM | POA: Diagnosis not present

## 2021-08-09 DIAGNOSIS — M5136 Other intervertebral disc degeneration, lumbar region: Secondary | ICD-10-CM | POA: Diagnosis not present

## 2021-08-09 DIAGNOSIS — M9905 Segmental and somatic dysfunction of pelvic region: Secondary | ICD-10-CM | POA: Diagnosis not present

## 2021-08-09 DIAGNOSIS — M955 Acquired deformity of pelvis: Secondary | ICD-10-CM | POA: Diagnosis not present

## 2021-08-23 DIAGNOSIS — M9903 Segmental and somatic dysfunction of lumbar region: Secondary | ICD-10-CM | POA: Diagnosis not present

## 2021-08-23 DIAGNOSIS — M5136 Other intervertebral disc degeneration, lumbar region: Secondary | ICD-10-CM | POA: Diagnosis not present

## 2021-08-23 DIAGNOSIS — M955 Acquired deformity of pelvis: Secondary | ICD-10-CM | POA: Diagnosis not present

## 2021-08-23 DIAGNOSIS — M9905 Segmental and somatic dysfunction of pelvic region: Secondary | ICD-10-CM | POA: Diagnosis not present

## 2021-08-29 DIAGNOSIS — H353131 Nonexudative age-related macular degeneration, bilateral, early dry stage: Secondary | ICD-10-CM | POA: Diagnosis not present

## 2021-08-29 DIAGNOSIS — G43109 Migraine with aura, not intractable, without status migrainosus: Secondary | ICD-10-CM | POA: Diagnosis not present

## 2021-09-19 DIAGNOSIS — M5136 Other intervertebral disc degeneration, lumbar region: Secondary | ICD-10-CM | POA: Diagnosis not present

## 2021-09-19 DIAGNOSIS — M9901 Segmental and somatic dysfunction of cervical region: Secondary | ICD-10-CM | POA: Diagnosis not present

## 2021-09-19 DIAGNOSIS — M62830 Muscle spasm of back: Secondary | ICD-10-CM | POA: Diagnosis not present

## 2021-09-19 DIAGNOSIS — M9903 Segmental and somatic dysfunction of lumbar region: Secondary | ICD-10-CM | POA: Diagnosis not present

## 2021-10-31 DIAGNOSIS — M5136 Other intervertebral disc degeneration, lumbar region: Secondary | ICD-10-CM | POA: Diagnosis not present

## 2021-10-31 DIAGNOSIS — M9903 Segmental and somatic dysfunction of lumbar region: Secondary | ICD-10-CM | POA: Diagnosis not present

## 2021-10-31 DIAGNOSIS — M62830 Muscle spasm of back: Secondary | ICD-10-CM | POA: Diagnosis not present

## 2021-10-31 DIAGNOSIS — M9901 Segmental and somatic dysfunction of cervical region: Secondary | ICD-10-CM | POA: Diagnosis not present

## 2021-11-08 DIAGNOSIS — M9901 Segmental and somatic dysfunction of cervical region: Secondary | ICD-10-CM | POA: Diagnosis not present

## 2021-11-08 DIAGNOSIS — M5431 Sciatica, right side: Secondary | ICD-10-CM | POA: Diagnosis not present

## 2021-11-08 DIAGNOSIS — M9903 Segmental and somatic dysfunction of lumbar region: Secondary | ICD-10-CM | POA: Diagnosis not present

## 2021-11-08 DIAGNOSIS — M5136 Other intervertebral disc degeneration, lumbar region: Secondary | ICD-10-CM | POA: Diagnosis not present

## 2021-11-09 DIAGNOSIS — M5136 Other intervertebral disc degeneration, lumbar region: Secondary | ICD-10-CM | POA: Diagnosis not present

## 2021-11-09 DIAGNOSIS — M9903 Segmental and somatic dysfunction of lumbar region: Secondary | ICD-10-CM | POA: Diagnosis not present

## 2021-11-09 DIAGNOSIS — M5416 Radiculopathy, lumbar region: Secondary | ICD-10-CM | POA: Diagnosis not present

## 2021-11-09 DIAGNOSIS — M5431 Sciatica, right side: Secondary | ICD-10-CM | POA: Diagnosis not present

## 2021-11-09 DIAGNOSIS — M9901 Segmental and somatic dysfunction of cervical region: Secondary | ICD-10-CM | POA: Diagnosis not present

## 2021-11-14 DIAGNOSIS — M9901 Segmental and somatic dysfunction of cervical region: Secondary | ICD-10-CM | POA: Diagnosis not present

## 2021-11-14 DIAGNOSIS — M9903 Segmental and somatic dysfunction of lumbar region: Secondary | ICD-10-CM | POA: Diagnosis not present

## 2021-11-14 DIAGNOSIS — M5136 Other intervertebral disc degeneration, lumbar region: Secondary | ICD-10-CM | POA: Diagnosis not present

## 2021-11-14 DIAGNOSIS — M5431 Sciatica, right side: Secondary | ICD-10-CM | POA: Diagnosis not present

## 2021-11-15 DIAGNOSIS — M5416 Radiculopathy, lumbar region: Secondary | ICD-10-CM | POA: Diagnosis not present

## 2021-11-22 DIAGNOSIS — M9903 Segmental and somatic dysfunction of lumbar region: Secondary | ICD-10-CM | POA: Diagnosis not present

## 2021-11-22 DIAGNOSIS — M5136 Other intervertebral disc degeneration, lumbar region: Secondary | ICD-10-CM | POA: Diagnosis not present

## 2021-11-22 DIAGNOSIS — M5431 Sciatica, right side: Secondary | ICD-10-CM | POA: Diagnosis not present

## 2021-11-22 DIAGNOSIS — M9901 Segmental and somatic dysfunction of cervical region: Secondary | ICD-10-CM | POA: Diagnosis not present

## 2021-11-25 DIAGNOSIS — H353131 Nonexudative age-related macular degeneration, bilateral, early dry stage: Secondary | ICD-10-CM | POA: Diagnosis not present

## 2021-11-25 DIAGNOSIS — H40003 Preglaucoma, unspecified, bilateral: Secondary | ICD-10-CM | POA: Diagnosis not present

## 2021-11-28 DIAGNOSIS — M5431 Sciatica, right side: Secondary | ICD-10-CM | POA: Diagnosis not present

## 2021-11-28 DIAGNOSIS — M5136 Other intervertebral disc degeneration, lumbar region: Secondary | ICD-10-CM | POA: Diagnosis not present

## 2021-11-28 DIAGNOSIS — M5416 Radiculopathy, lumbar region: Secondary | ICD-10-CM | POA: Diagnosis not present

## 2021-11-28 DIAGNOSIS — M9903 Segmental and somatic dysfunction of lumbar region: Secondary | ICD-10-CM | POA: Diagnosis not present

## 2021-11-28 DIAGNOSIS — M9901 Segmental and somatic dysfunction of cervical region: Secondary | ICD-10-CM | POA: Diagnosis not present

## 2021-12-06 DIAGNOSIS — M9903 Segmental and somatic dysfunction of lumbar region: Secondary | ICD-10-CM | POA: Diagnosis not present

## 2021-12-06 DIAGNOSIS — M9901 Segmental and somatic dysfunction of cervical region: Secondary | ICD-10-CM | POA: Diagnosis not present

## 2021-12-06 DIAGNOSIS — M5431 Sciatica, right side: Secondary | ICD-10-CM | POA: Diagnosis not present

## 2021-12-06 DIAGNOSIS — M5136 Other intervertebral disc degeneration, lumbar region: Secondary | ICD-10-CM | POA: Diagnosis not present

## 2021-12-14 DIAGNOSIS — M62830 Muscle spasm of back: Secondary | ICD-10-CM | POA: Diagnosis not present

## 2021-12-14 DIAGNOSIS — M9903 Segmental and somatic dysfunction of lumbar region: Secondary | ICD-10-CM | POA: Diagnosis not present

## 2021-12-14 DIAGNOSIS — M5136 Other intervertebral disc degeneration, lumbar region: Secondary | ICD-10-CM | POA: Diagnosis not present

## 2021-12-14 DIAGNOSIS — M9901 Segmental and somatic dysfunction of cervical region: Secondary | ICD-10-CM | POA: Diagnosis not present

## 2021-12-19 DIAGNOSIS — M5416 Radiculopathy, lumbar region: Secondary | ICD-10-CM | POA: Diagnosis not present

## 2021-12-27 DIAGNOSIS — D500 Iron deficiency anemia secondary to blood loss (chronic): Secondary | ICD-10-CM | POA: Diagnosis not present

## 2022-01-04 DIAGNOSIS — M5416 Radiculopathy, lumbar region: Secondary | ICD-10-CM | POA: Diagnosis not present

## 2022-01-19 DIAGNOSIS — M25511 Pain in right shoulder: Secondary | ICD-10-CM | POA: Diagnosis not present

## 2022-02-15 DIAGNOSIS — M9901 Segmental and somatic dysfunction of cervical region: Secondary | ICD-10-CM | POA: Diagnosis not present

## 2022-02-15 DIAGNOSIS — M9903 Segmental and somatic dysfunction of lumbar region: Secondary | ICD-10-CM | POA: Diagnosis not present

## 2022-02-15 DIAGNOSIS — M5136 Other intervertebral disc degeneration, lumbar region: Secondary | ICD-10-CM | POA: Diagnosis not present

## 2022-02-15 DIAGNOSIS — M62830 Muscle spasm of back: Secondary | ICD-10-CM | POA: Diagnosis not present

## 2022-03-15 DIAGNOSIS — M9903 Segmental and somatic dysfunction of lumbar region: Secondary | ICD-10-CM | POA: Diagnosis not present

## 2022-03-15 DIAGNOSIS — M5136 Other intervertebral disc degeneration, lumbar region: Secondary | ICD-10-CM | POA: Diagnosis not present

## 2022-03-15 DIAGNOSIS — M62830 Muscle spasm of back: Secondary | ICD-10-CM | POA: Diagnosis not present

## 2022-03-15 DIAGNOSIS — M9901 Segmental and somatic dysfunction of cervical region: Secondary | ICD-10-CM | POA: Diagnosis not present

## 2022-04-12 DIAGNOSIS — M62830 Muscle spasm of back: Secondary | ICD-10-CM | POA: Diagnosis not present

## 2022-04-12 DIAGNOSIS — M9901 Segmental and somatic dysfunction of cervical region: Secondary | ICD-10-CM | POA: Diagnosis not present

## 2022-04-12 DIAGNOSIS — M5136 Other intervertebral disc degeneration, lumbar region: Secondary | ICD-10-CM | POA: Diagnosis not present

## 2022-04-12 DIAGNOSIS — M9903 Segmental and somatic dysfunction of lumbar region: Secondary | ICD-10-CM | POA: Diagnosis not present

## 2022-05-03 DIAGNOSIS — D2271 Melanocytic nevi of right lower limb, including hip: Secondary | ICD-10-CM | POA: Diagnosis not present

## 2022-05-03 DIAGNOSIS — D225 Melanocytic nevi of trunk: Secondary | ICD-10-CM | POA: Diagnosis not present

## 2022-05-03 DIAGNOSIS — L814 Other melanin hyperpigmentation: Secondary | ICD-10-CM | POA: Diagnosis not present

## 2022-05-03 DIAGNOSIS — D2272 Melanocytic nevi of left lower limb, including hip: Secondary | ICD-10-CM | POA: Diagnosis not present

## 2022-05-03 DIAGNOSIS — D2261 Melanocytic nevi of right upper limb, including shoulder: Secondary | ICD-10-CM | POA: Diagnosis not present

## 2022-05-03 DIAGNOSIS — D2262 Melanocytic nevi of left upper limb, including shoulder: Secondary | ICD-10-CM | POA: Diagnosis not present

## 2022-05-03 DIAGNOSIS — L821 Other seborrheic keratosis: Secondary | ICD-10-CM | POA: Diagnosis not present

## 2022-05-25 DIAGNOSIS — H40003 Preglaucoma, unspecified, bilateral: Secondary | ICD-10-CM | POA: Diagnosis not present

## 2022-05-25 DIAGNOSIS — Z961 Presence of intraocular lens: Secondary | ICD-10-CM | POA: Diagnosis not present

## 2022-05-25 DIAGNOSIS — H353131 Nonexudative age-related macular degeneration, bilateral, early dry stage: Secondary | ICD-10-CM | POA: Diagnosis not present

## 2022-06-07 DIAGNOSIS — M9903 Segmental and somatic dysfunction of lumbar region: Secondary | ICD-10-CM | POA: Diagnosis not present

## 2022-06-07 DIAGNOSIS — M5136 Other intervertebral disc degeneration, lumbar region: Secondary | ICD-10-CM | POA: Diagnosis not present

## 2022-06-07 DIAGNOSIS — M62830 Muscle spasm of back: Secondary | ICD-10-CM | POA: Diagnosis not present

## 2022-06-07 DIAGNOSIS — M9901 Segmental and somatic dysfunction of cervical region: Secondary | ICD-10-CM | POA: Diagnosis not present

## 2022-06-19 DIAGNOSIS — R739 Hyperglycemia, unspecified: Secondary | ICD-10-CM | POA: Diagnosis not present

## 2022-06-19 DIAGNOSIS — E7800 Pure hypercholesterolemia, unspecified: Secondary | ICD-10-CM | POA: Diagnosis not present

## 2022-06-19 DIAGNOSIS — D500 Iron deficiency anemia secondary to blood loss (chronic): Secondary | ICD-10-CM | POA: Diagnosis not present

## 2022-06-19 DIAGNOSIS — E538 Deficiency of other specified B group vitamins: Secondary | ICD-10-CM | POA: Diagnosis not present

## 2022-06-19 DIAGNOSIS — Z79899 Other long term (current) drug therapy: Secondary | ICD-10-CM | POA: Diagnosis not present

## 2022-06-26 DIAGNOSIS — E538 Deficiency of other specified B group vitamins: Secondary | ICD-10-CM | POA: Diagnosis not present

## 2022-06-26 DIAGNOSIS — D500 Iron deficiency anemia secondary to blood loss (chronic): Secondary | ICD-10-CM | POA: Diagnosis not present

## 2022-06-26 DIAGNOSIS — Z0000 Encounter for general adult medical examination without abnormal findings: Secondary | ICD-10-CM | POA: Diagnosis not present

## 2022-06-26 DIAGNOSIS — Z79899 Other long term (current) drug therapy: Secondary | ICD-10-CM | POA: Diagnosis not present

## 2022-06-27 DIAGNOSIS — M9901 Segmental and somatic dysfunction of cervical region: Secondary | ICD-10-CM | POA: Diagnosis not present

## 2022-06-27 DIAGNOSIS — M9903 Segmental and somatic dysfunction of lumbar region: Secondary | ICD-10-CM | POA: Diagnosis not present

## 2022-06-27 DIAGNOSIS — M5136 Other intervertebral disc degeneration, lumbar region: Secondary | ICD-10-CM | POA: Diagnosis not present

## 2022-06-27 DIAGNOSIS — M62830 Muscle spasm of back: Secondary | ICD-10-CM | POA: Diagnosis not present

## 2022-07-19 ENCOUNTER — Other Ambulatory Visit: Payer: Self-pay | Admitting: Internal Medicine

## 2022-07-19 DIAGNOSIS — Z1231 Encounter for screening mammogram for malignant neoplasm of breast: Secondary | ICD-10-CM

## 2022-07-25 DIAGNOSIS — M9901 Segmental and somatic dysfunction of cervical region: Secondary | ICD-10-CM | POA: Diagnosis not present

## 2022-07-25 DIAGNOSIS — M62830 Muscle spasm of back: Secondary | ICD-10-CM | POA: Diagnosis not present

## 2022-07-25 DIAGNOSIS — M5136 Other intervertebral disc degeneration, lumbar region: Secondary | ICD-10-CM | POA: Diagnosis not present

## 2022-07-25 DIAGNOSIS — M9903 Segmental and somatic dysfunction of lumbar region: Secondary | ICD-10-CM | POA: Diagnosis not present

## 2022-08-03 ENCOUNTER — Encounter: Payer: Self-pay | Admitting: Oncology

## 2022-08-10 ENCOUNTER — Ambulatory Visit
Admission: RE | Admit: 2022-08-10 | Discharge: 2022-08-10 | Disposition: A | Discharge status: Home / Self Care | Payer: No Typology Code available for payment source | Source: Ambulatory Visit | Attending: Internal Medicine | Admitting: Internal Medicine

## 2022-08-10 DIAGNOSIS — Z1231 Encounter for screening mammogram for malignant neoplasm of breast: Secondary | ICD-10-CM | POA: Insufficient documentation

## 2022-08-22 DIAGNOSIS — M9903 Segmental and somatic dysfunction of lumbar region: Secondary | ICD-10-CM | POA: Diagnosis not present

## 2022-08-22 DIAGNOSIS — M9901 Segmental and somatic dysfunction of cervical region: Secondary | ICD-10-CM | POA: Diagnosis not present

## 2022-08-22 DIAGNOSIS — M62830 Muscle spasm of back: Secondary | ICD-10-CM | POA: Diagnosis not present

## 2022-08-22 DIAGNOSIS — M5136 Other intervertebral disc degeneration, lumbar region: Secondary | ICD-10-CM | POA: Diagnosis not present

## 2022-09-26 DIAGNOSIS — M62830 Muscle spasm of back: Secondary | ICD-10-CM | POA: Diagnosis not present

## 2022-09-26 DIAGNOSIS — M9901 Segmental and somatic dysfunction of cervical region: Secondary | ICD-10-CM | POA: Diagnosis not present

## 2022-09-26 DIAGNOSIS — M9903 Segmental and somatic dysfunction of lumbar region: Secondary | ICD-10-CM | POA: Diagnosis not present

## 2022-09-26 DIAGNOSIS — M5136 Other intervertebral disc degeneration, lumbar region: Secondary | ICD-10-CM | POA: Diagnosis not present

## 2022-10-24 DIAGNOSIS — M9903 Segmental and somatic dysfunction of lumbar region: Secondary | ICD-10-CM | POA: Diagnosis not present

## 2022-10-24 DIAGNOSIS — M9901 Segmental and somatic dysfunction of cervical region: Secondary | ICD-10-CM | POA: Diagnosis not present

## 2022-10-24 DIAGNOSIS — M5136 Other intervertebral disc degeneration, lumbar region: Secondary | ICD-10-CM | POA: Diagnosis not present

## 2022-10-24 DIAGNOSIS — M62830 Muscle spasm of back: Secondary | ICD-10-CM | POA: Diagnosis not present

## 2022-11-21 DIAGNOSIS — M62830 Muscle spasm of back: Secondary | ICD-10-CM | POA: Diagnosis not present

## 2022-11-21 DIAGNOSIS — M5136 Other intervertebral disc degeneration, lumbar region: Secondary | ICD-10-CM | POA: Diagnosis not present

## 2022-11-21 DIAGNOSIS — M9901 Segmental and somatic dysfunction of cervical region: Secondary | ICD-10-CM | POA: Diagnosis not present

## 2022-11-21 DIAGNOSIS — M9903 Segmental and somatic dysfunction of lumbar region: Secondary | ICD-10-CM | POA: Diagnosis not present

## 2022-11-28 DIAGNOSIS — H353131 Nonexudative age-related macular degeneration, bilateral, early dry stage: Secondary | ICD-10-CM | POA: Diagnosis not present

## 2022-12-19 DIAGNOSIS — M62830 Muscle spasm of back: Secondary | ICD-10-CM | POA: Diagnosis not present

## 2022-12-19 DIAGNOSIS — M9901 Segmental and somatic dysfunction of cervical region: Secondary | ICD-10-CM | POA: Diagnosis not present

## 2022-12-19 DIAGNOSIS — M5136 Other intervertebral disc degeneration, lumbar region: Secondary | ICD-10-CM | POA: Diagnosis not present

## 2022-12-19 DIAGNOSIS — M9903 Segmental and somatic dysfunction of lumbar region: Secondary | ICD-10-CM | POA: Diagnosis not present

## 2022-12-28 DIAGNOSIS — H903 Sensorineural hearing loss, bilateral: Secondary | ICD-10-CM | POA: Diagnosis not present

## 2022-12-28 DIAGNOSIS — H6063 Unspecified chronic otitis externa, bilateral: Secondary | ICD-10-CM | POA: Diagnosis not present

## 2022-12-28 DIAGNOSIS — H6123 Impacted cerumen, bilateral: Secondary | ICD-10-CM | POA: Diagnosis not present

## 2023-01-16 DIAGNOSIS — M9903 Segmental and somatic dysfunction of lumbar region: Secondary | ICD-10-CM | POA: Diagnosis not present

## 2023-01-16 DIAGNOSIS — M62830 Muscle spasm of back: Secondary | ICD-10-CM | POA: Diagnosis not present

## 2023-01-16 DIAGNOSIS — M5136 Other intervertebral disc degeneration, lumbar region: Secondary | ICD-10-CM | POA: Diagnosis not present

## 2023-01-16 DIAGNOSIS — M9901 Segmental and somatic dysfunction of cervical region: Secondary | ICD-10-CM | POA: Diagnosis not present

## 2023-02-15 DIAGNOSIS — M9901 Segmental and somatic dysfunction of cervical region: Secondary | ICD-10-CM | POA: Diagnosis not present

## 2023-02-15 DIAGNOSIS — M62830 Muscle spasm of back: Secondary | ICD-10-CM | POA: Diagnosis not present

## 2023-02-15 DIAGNOSIS — M9903 Segmental and somatic dysfunction of lumbar region: Secondary | ICD-10-CM | POA: Diagnosis not present

## 2023-02-15 DIAGNOSIS — M5136 Other intervertebral disc degeneration, lumbar region: Secondary | ICD-10-CM | POA: Diagnosis not present

## 2023-02-27 DIAGNOSIS — M5136 Other intervertebral disc degeneration, lumbar region: Secondary | ICD-10-CM | POA: Diagnosis not present

## 2023-02-27 DIAGNOSIS — M9901 Segmental and somatic dysfunction of cervical region: Secondary | ICD-10-CM | POA: Diagnosis not present

## 2023-02-27 DIAGNOSIS — M9903 Segmental and somatic dysfunction of lumbar region: Secondary | ICD-10-CM | POA: Diagnosis not present

## 2023-02-27 DIAGNOSIS — M62830 Muscle spasm of back: Secondary | ICD-10-CM | POA: Diagnosis not present

## 2023-03-13 DIAGNOSIS — M9903 Segmental and somatic dysfunction of lumbar region: Secondary | ICD-10-CM | POA: Diagnosis not present

## 2023-03-13 DIAGNOSIS — M5136 Other intervertebral disc degeneration, lumbar region: Secondary | ICD-10-CM | POA: Diagnosis not present

## 2023-03-13 DIAGNOSIS — M62830 Muscle spasm of back: Secondary | ICD-10-CM | POA: Diagnosis not present

## 2023-03-13 DIAGNOSIS — M9901 Segmental and somatic dysfunction of cervical region: Secondary | ICD-10-CM | POA: Diagnosis not present

## 2023-04-10 DIAGNOSIS — M9901 Segmental and somatic dysfunction of cervical region: Secondary | ICD-10-CM | POA: Diagnosis not present

## 2023-04-10 DIAGNOSIS — M5136 Other intervertebral disc degeneration, lumbar region: Secondary | ICD-10-CM | POA: Diagnosis not present

## 2023-04-10 DIAGNOSIS — M62830 Muscle spasm of back: Secondary | ICD-10-CM | POA: Diagnosis not present

## 2023-04-10 DIAGNOSIS — M9903 Segmental and somatic dysfunction of lumbar region: Secondary | ICD-10-CM | POA: Diagnosis not present

## 2023-05-04 DIAGNOSIS — L814 Other melanin hyperpigmentation: Secondary | ICD-10-CM | POA: Diagnosis not present

## 2023-05-04 DIAGNOSIS — D2262 Melanocytic nevi of left upper limb, including shoulder: Secondary | ICD-10-CM | POA: Diagnosis not present

## 2023-05-04 DIAGNOSIS — L821 Other seborrheic keratosis: Secondary | ICD-10-CM | POA: Diagnosis not present

## 2023-05-04 DIAGNOSIS — D2271 Melanocytic nevi of right lower limb, including hip: Secondary | ICD-10-CM | POA: Diagnosis not present

## 2023-05-04 DIAGNOSIS — D225 Melanocytic nevi of trunk: Secondary | ICD-10-CM | POA: Diagnosis not present

## 2023-05-04 DIAGNOSIS — D2261 Melanocytic nevi of right upper limb, including shoulder: Secondary | ICD-10-CM | POA: Diagnosis not present

## 2023-05-04 DIAGNOSIS — D2272 Melanocytic nevi of left lower limb, including hip: Secondary | ICD-10-CM | POA: Diagnosis not present

## 2023-05-22 DIAGNOSIS — M9903 Segmental and somatic dysfunction of lumbar region: Secondary | ICD-10-CM | POA: Diagnosis not present

## 2023-05-22 DIAGNOSIS — M62830 Muscle spasm of back: Secondary | ICD-10-CM | POA: Diagnosis not present

## 2023-05-22 DIAGNOSIS — M5136 Other intervertebral disc degeneration, lumbar region: Secondary | ICD-10-CM | POA: Diagnosis not present

## 2023-05-22 DIAGNOSIS — M9901 Segmental and somatic dysfunction of cervical region: Secondary | ICD-10-CM | POA: Diagnosis not present

## 2023-05-28 DIAGNOSIS — H40003 Preglaucoma, unspecified, bilateral: Secondary | ICD-10-CM | POA: Diagnosis not present

## 2023-06-04 DIAGNOSIS — H40003 Preglaucoma, unspecified, bilateral: Secondary | ICD-10-CM | POA: Diagnosis not present

## 2023-06-04 DIAGNOSIS — Z961 Presence of intraocular lens: Secondary | ICD-10-CM | POA: Diagnosis not present

## 2023-06-19 DIAGNOSIS — M5136 Other intervertebral disc degeneration, lumbar region: Secondary | ICD-10-CM | POA: Diagnosis not present

## 2023-06-19 DIAGNOSIS — M9901 Segmental and somatic dysfunction of cervical region: Secondary | ICD-10-CM | POA: Diagnosis not present

## 2023-06-19 DIAGNOSIS — M62830 Muscle spasm of back: Secondary | ICD-10-CM | POA: Diagnosis not present

## 2023-06-19 DIAGNOSIS — M9903 Segmental and somatic dysfunction of lumbar region: Secondary | ICD-10-CM | POA: Diagnosis not present

## 2023-06-22 DIAGNOSIS — Z79899 Other long term (current) drug therapy: Secondary | ICD-10-CM | POA: Diagnosis not present

## 2023-06-22 DIAGNOSIS — D500 Iron deficiency anemia secondary to blood loss (chronic): Secondary | ICD-10-CM | POA: Diagnosis not present

## 2023-06-22 DIAGNOSIS — E538 Deficiency of other specified B group vitamins: Secondary | ICD-10-CM | POA: Diagnosis not present

## 2023-07-05 DIAGNOSIS — I10 Essential (primary) hypertension: Secondary | ICD-10-CM | POA: Diagnosis not present

## 2023-07-05 DIAGNOSIS — E538 Deficiency of other specified B group vitamins: Secondary | ICD-10-CM | POA: Diagnosis not present

## 2023-07-05 DIAGNOSIS — Z0000 Encounter for general adult medical examination without abnormal findings: Secondary | ICD-10-CM | POA: Diagnosis not present

## 2023-07-05 DIAGNOSIS — Z79899 Other long term (current) drug therapy: Secondary | ICD-10-CM | POA: Diagnosis not present

## 2023-07-24 DIAGNOSIS — M9903 Segmental and somatic dysfunction of lumbar region: Secondary | ICD-10-CM | POA: Diagnosis not present

## 2023-07-24 DIAGNOSIS — M9901 Segmental and somatic dysfunction of cervical region: Secondary | ICD-10-CM | POA: Diagnosis not present

## 2023-07-24 DIAGNOSIS — M62830 Muscle spasm of back: Secondary | ICD-10-CM | POA: Diagnosis not present

## 2023-07-24 DIAGNOSIS — M5136 Other intervertebral disc degeneration, lumbar region: Secondary | ICD-10-CM | POA: Diagnosis not present

## 2023-08-02 DIAGNOSIS — M62830 Muscle spasm of back: Secondary | ICD-10-CM | POA: Diagnosis not present

## 2023-08-02 DIAGNOSIS — M9901 Segmental and somatic dysfunction of cervical region: Secondary | ICD-10-CM | POA: Diagnosis not present

## 2023-08-02 DIAGNOSIS — M5136 Other intervertebral disc degeneration, lumbar region: Secondary | ICD-10-CM | POA: Diagnosis not present

## 2023-08-02 DIAGNOSIS — M9903 Segmental and somatic dysfunction of lumbar region: Secondary | ICD-10-CM | POA: Diagnosis not present

## 2023-09-11 ENCOUNTER — Other Ambulatory Visit: Payer: Self-pay | Admitting: Otolaryngology

## 2023-09-11 DIAGNOSIS — R430 Anosmia: Secondary | ICD-10-CM | POA: Diagnosis not present

## 2023-09-11 DIAGNOSIS — H903 Sensorineural hearing loss, bilateral: Secondary | ICD-10-CM | POA: Diagnosis not present

## 2023-09-11 DIAGNOSIS — R490 Dysphonia: Secondary | ICD-10-CM | POA: Diagnosis not present

## 2023-09-11 DIAGNOSIS — J342 Deviated nasal septum: Secondary | ICD-10-CM | POA: Diagnosis not present

## 2023-09-20 ENCOUNTER — Encounter: Payer: Self-pay | Admitting: Oncology

## 2023-09-27 ENCOUNTER — Other Ambulatory Visit: Payer: No Typology Code available for payment source

## 2023-10-09 ENCOUNTER — Other Ambulatory Visit: Payer: Self-pay | Admitting: Internal Medicine

## 2023-10-09 DIAGNOSIS — Z1231 Encounter for screening mammogram for malignant neoplasm of breast: Secondary | ICD-10-CM

## 2023-10-10 ENCOUNTER — Other Ambulatory Visit: Payer: No Typology Code available for payment source

## 2023-10-11 DIAGNOSIS — D485 Neoplasm of uncertain behavior of skin: Secondary | ICD-10-CM | POA: Diagnosis not present

## 2023-10-11 DIAGNOSIS — D044 Carcinoma in situ of skin of scalp and neck: Secondary | ICD-10-CM | POA: Diagnosis not present

## 2023-10-22 ENCOUNTER — Ambulatory Visit
Admission: RE | Admit: 2023-10-22 | Discharge: 2023-10-22 | Disposition: A | Discharge status: Home / Self Care | Payer: HMO | Source: Ambulatory Visit | Attending: Otolaryngology | Admitting: Otolaryngology

## 2023-10-22 ENCOUNTER — Encounter: Payer: Self-pay | Admitting: Oncology

## 2023-10-22 DIAGNOSIS — R430 Anosmia: Secondary | ICD-10-CM

## 2023-10-22 MED ORDER — GADOPICLENOL 0.5 MMOL/ML IV SOLN
7.5000 mL | Freq: Once | INTRAVENOUS | Status: AC | PRN
  Administered 2023-10-22: 7.5 mL via INTRAVENOUS

## 2023-10-24 DIAGNOSIS — D044 Carcinoma in situ of skin of scalp and neck: Secondary | ICD-10-CM | POA: Diagnosis not present

## 2023-10-25 ENCOUNTER — Ambulatory Visit
Admission: RE | Admit: 2023-10-25 | Discharge: 2023-10-25 | Disposition: A | Discharge status: Home / Self Care | Payer: HMO | Source: Ambulatory Visit | Attending: Internal Medicine | Admitting: Internal Medicine

## 2023-10-25 DIAGNOSIS — Z1231 Encounter for screening mammogram for malignant neoplasm of breast: Secondary | ICD-10-CM | POA: Diagnosis not present

## 2023-12-18 DIAGNOSIS — H35373 Puckering of macula, bilateral: Secondary | ICD-10-CM | POA: Diagnosis not present

## 2023-12-18 DIAGNOSIS — H40003 Preglaucoma, unspecified, bilateral: Secondary | ICD-10-CM | POA: Diagnosis not present

## 2023-12-18 DIAGNOSIS — H353131 Nonexudative age-related macular degeneration, bilateral, early dry stage: Secondary | ICD-10-CM | POA: Diagnosis not present

## 2023-12-18 DIAGNOSIS — Z961 Presence of intraocular lens: Secondary | ICD-10-CM | POA: Diagnosis not present

## 2024-01-03 DIAGNOSIS — M9901 Segmental and somatic dysfunction of cervical region: Secondary | ICD-10-CM | POA: Diagnosis not present

## 2024-01-03 DIAGNOSIS — M9903 Segmental and somatic dysfunction of lumbar region: Secondary | ICD-10-CM | POA: Diagnosis not present

## 2024-01-03 DIAGNOSIS — M5136 Other intervertebral disc degeneration, lumbar region with discogenic back pain only: Secondary | ICD-10-CM | POA: Diagnosis not present

## 2024-01-03 DIAGNOSIS — M62830 Muscle spasm of back: Secondary | ICD-10-CM | POA: Diagnosis not present

## 2024-01-04 DIAGNOSIS — Z85828 Personal history of other malignant neoplasm of skin: Secondary | ICD-10-CM | POA: Diagnosis not present

## 2024-01-09 DIAGNOSIS — H6123 Impacted cerumen, bilateral: Secondary | ICD-10-CM | POA: Diagnosis not present

## 2024-01-09 DIAGNOSIS — H903 Sensorineural hearing loss, bilateral: Secondary | ICD-10-CM | POA: Diagnosis not present

## 2024-01-29 DIAGNOSIS — M9901 Segmental and somatic dysfunction of cervical region: Secondary | ICD-10-CM | POA: Diagnosis not present

## 2024-01-29 DIAGNOSIS — M5136 Other intervertebral disc degeneration, lumbar region with discogenic back pain only: Secondary | ICD-10-CM | POA: Diagnosis not present

## 2024-01-29 DIAGNOSIS — M62830 Muscle spasm of back: Secondary | ICD-10-CM | POA: Diagnosis not present

## 2024-01-29 DIAGNOSIS — M9903 Segmental and somatic dysfunction of lumbar region: Secondary | ICD-10-CM | POA: Diagnosis not present

## 2024-02-26 DIAGNOSIS — M5136 Other intervertebral disc degeneration, lumbar region with discogenic back pain only: Secondary | ICD-10-CM | POA: Diagnosis not present

## 2024-02-26 DIAGNOSIS — M9901 Segmental and somatic dysfunction of cervical region: Secondary | ICD-10-CM | POA: Diagnosis not present

## 2024-02-26 DIAGNOSIS — M9903 Segmental and somatic dysfunction of lumbar region: Secondary | ICD-10-CM | POA: Diagnosis not present

## 2024-02-26 DIAGNOSIS — M62830 Muscle spasm of back: Secondary | ICD-10-CM | POA: Diagnosis not present

## 2024-03-06 DIAGNOSIS — R4189 Other symptoms and signs involving cognitive functions and awareness: Secondary | ICD-10-CM | POA: Diagnosis not present

## 2024-03-06 DIAGNOSIS — R0683 Snoring: Secondary | ICD-10-CM | POA: Diagnosis not present

## 2024-03-06 DIAGNOSIS — E538 Deficiency of other specified B group vitamins: Secondary | ICD-10-CM | POA: Diagnosis not present

## 2024-03-06 DIAGNOSIS — G939 Disorder of brain, unspecified: Secondary | ICD-10-CM | POA: Diagnosis not present

## 2024-03-16 DIAGNOSIS — G4730 Sleep apnea, unspecified: Secondary | ICD-10-CM | POA: Diagnosis not present

## 2024-03-25 DIAGNOSIS — M5136 Other intervertebral disc degeneration, lumbar region with discogenic back pain only: Secondary | ICD-10-CM | POA: Diagnosis not present

## 2024-03-25 DIAGNOSIS — M9901 Segmental and somatic dysfunction of cervical region: Secondary | ICD-10-CM | POA: Diagnosis not present

## 2024-03-25 DIAGNOSIS — M62830 Muscle spasm of back: Secondary | ICD-10-CM | POA: Diagnosis not present

## 2024-03-25 DIAGNOSIS — M9903 Segmental and somatic dysfunction of lumbar region: Secondary | ICD-10-CM | POA: Diagnosis not present

## 2024-04-15 DIAGNOSIS — G4733 Obstructive sleep apnea (adult) (pediatric): Secondary | ICD-10-CM | POA: Diagnosis not present

## 2024-05-07 DIAGNOSIS — D225 Melanocytic nevi of trunk: Secondary | ICD-10-CM | POA: Diagnosis not present

## 2024-05-07 DIAGNOSIS — L821 Other seborrheic keratosis: Secondary | ICD-10-CM | POA: Diagnosis not present

## 2024-05-07 DIAGNOSIS — D2261 Melanocytic nevi of right upper limb, including shoulder: Secondary | ICD-10-CM | POA: Diagnosis not present

## 2024-05-07 DIAGNOSIS — D2262 Melanocytic nevi of left upper limb, including shoulder: Secondary | ICD-10-CM | POA: Diagnosis not present

## 2024-05-07 DIAGNOSIS — D2272 Melanocytic nevi of left lower limb, including hip: Secondary | ICD-10-CM | POA: Diagnosis not present

## 2024-05-07 DIAGNOSIS — D2271 Melanocytic nevi of right lower limb, including hip: Secondary | ICD-10-CM | POA: Diagnosis not present

## 2024-05-07 DIAGNOSIS — L570 Actinic keratosis: Secondary | ICD-10-CM | POA: Diagnosis not present

## 2024-05-07 DIAGNOSIS — L814 Other melanin hyperpigmentation: Secondary | ICD-10-CM | POA: Diagnosis not present

## 2024-05-16 DIAGNOSIS — G4733 Obstructive sleep apnea (adult) (pediatric): Secondary | ICD-10-CM | POA: Diagnosis not present

## 2024-05-16 DIAGNOSIS — N8110 Cystocele, unspecified: Secondary | ICD-10-CM | POA: Diagnosis not present

## 2024-05-16 DIAGNOSIS — N952 Postmenopausal atrophic vaginitis: Secondary | ICD-10-CM | POA: Diagnosis not present

## 2024-06-15 DIAGNOSIS — G4733 Obstructive sleep apnea (adult) (pediatric): Secondary | ICD-10-CM | POA: Diagnosis not present

## 2024-06-16 DIAGNOSIS — H40003 Preglaucoma, unspecified, bilateral: Secondary | ICD-10-CM | POA: Diagnosis not present

## 2024-06-19 DIAGNOSIS — G4733 Obstructive sleep apnea (adult) (pediatric): Secondary | ICD-10-CM | POA: Diagnosis not present

## 2024-06-19 DIAGNOSIS — E538 Deficiency of other specified B group vitamins: Secondary | ICD-10-CM | POA: Diagnosis not present

## 2024-06-19 DIAGNOSIS — G939 Disorder of brain, unspecified: Secondary | ICD-10-CM | POA: Diagnosis not present

## 2024-06-19 DIAGNOSIS — R4189 Other symptoms and signs involving cognitive functions and awareness: Secondary | ICD-10-CM | POA: Diagnosis not present

## 2024-06-23 DIAGNOSIS — H353131 Nonexudative age-related macular degeneration, bilateral, early dry stage: Secondary | ICD-10-CM | POA: Diagnosis not present

## 2024-06-23 DIAGNOSIS — H40003 Preglaucoma, unspecified, bilateral: Secondary | ICD-10-CM | POA: Diagnosis not present

## 2024-06-23 DIAGNOSIS — H35373 Puckering of macula, bilateral: Secondary | ICD-10-CM | POA: Diagnosis not present

## 2024-06-23 DIAGNOSIS — Z961 Presence of intraocular lens: Secondary | ICD-10-CM | POA: Diagnosis not present

## 2024-07-02 DIAGNOSIS — E538 Deficiency of other specified B group vitamins: Secondary | ICD-10-CM | POA: Diagnosis not present

## 2024-07-02 DIAGNOSIS — Z79899 Other long term (current) drug therapy: Secondary | ICD-10-CM | POA: Diagnosis not present

## 2024-07-05 DIAGNOSIS — G4733 Obstructive sleep apnea (adult) (pediatric): Secondary | ICD-10-CM | POA: Diagnosis not present

## 2024-07-09 DIAGNOSIS — Z0000 Encounter for general adult medical examination without abnormal findings: Secondary | ICD-10-CM | POA: Diagnosis not present

## 2024-07-09 DIAGNOSIS — E538 Deficiency of other specified B group vitamins: Secondary | ICD-10-CM | POA: Diagnosis not present

## 2024-07-09 DIAGNOSIS — R739 Hyperglycemia, unspecified: Secondary | ICD-10-CM | POA: Diagnosis not present

## 2024-07-09 DIAGNOSIS — G4733 Obstructive sleep apnea (adult) (pediatric): Secondary | ICD-10-CM | POA: Diagnosis not present

## 2024-07-09 DIAGNOSIS — Z79899 Other long term (current) drug therapy: Secondary | ICD-10-CM | POA: Diagnosis not present

## 2024-07-09 DIAGNOSIS — D500 Iron deficiency anemia secondary to blood loss (chronic): Secondary | ICD-10-CM | POA: Diagnosis not present

## 2024-07-10 DIAGNOSIS — G4733 Obstructive sleep apnea (adult) (pediatric): Secondary | ICD-10-CM | POA: Diagnosis not present

## 2024-07-15 DIAGNOSIS — M5136 Other intervertebral disc degeneration, lumbar region with discogenic back pain only: Secondary | ICD-10-CM | POA: Diagnosis not present

## 2024-07-15 DIAGNOSIS — M9903 Segmental and somatic dysfunction of lumbar region: Secondary | ICD-10-CM | POA: Diagnosis not present

## 2024-07-15 DIAGNOSIS — M9901 Segmental and somatic dysfunction of cervical region: Secondary | ICD-10-CM | POA: Diagnosis not present

## 2024-07-15 DIAGNOSIS — M62830 Muscle spasm of back: Secondary | ICD-10-CM | POA: Diagnosis not present

## 2024-07-16 DIAGNOSIS — G4733 Obstructive sleep apnea (adult) (pediatric): Secondary | ICD-10-CM | POA: Diagnosis not present

## 2024-07-24 DIAGNOSIS — G4733 Obstructive sleep apnea (adult) (pediatric): Secondary | ICD-10-CM | POA: Diagnosis not present

## 2024-07-24 DIAGNOSIS — H903 Sensorineural hearing loss, bilateral: Secondary | ICD-10-CM | POA: Diagnosis not present

## 2024-07-24 DIAGNOSIS — H6123 Impacted cerumen, bilateral: Secondary | ICD-10-CM | POA: Diagnosis not present

## 2024-08-12 DIAGNOSIS — M9901 Segmental and somatic dysfunction of cervical region: Secondary | ICD-10-CM | POA: Diagnosis not present

## 2024-08-12 DIAGNOSIS — M9903 Segmental and somatic dysfunction of lumbar region: Secondary | ICD-10-CM | POA: Diagnosis not present

## 2024-08-12 DIAGNOSIS — M62830 Muscle spasm of back: Secondary | ICD-10-CM | POA: Diagnosis not present

## 2024-08-12 DIAGNOSIS — M5136 Other intervertebral disc degeneration, lumbar region with discogenic back pain only: Secondary | ICD-10-CM | POA: Diagnosis not present

## 2024-08-16 DIAGNOSIS — G4733 Obstructive sleep apnea (adult) (pediatric): Secondary | ICD-10-CM | POA: Diagnosis not present

## 2024-08-19 DIAGNOSIS — M9901 Segmental and somatic dysfunction of cervical region: Secondary | ICD-10-CM | POA: Diagnosis not present

## 2024-08-19 DIAGNOSIS — M62830 Muscle spasm of back: Secondary | ICD-10-CM | POA: Diagnosis not present

## 2024-08-19 DIAGNOSIS — M9903 Segmental and somatic dysfunction of lumbar region: Secondary | ICD-10-CM | POA: Diagnosis not present

## 2024-08-19 DIAGNOSIS — M5136 Other intervertebral disc degeneration, lumbar region with discogenic back pain only: Secondary | ICD-10-CM | POA: Diagnosis not present

## 2024-09-01 ENCOUNTER — Ambulatory Visit: Attending: Internal Medicine

## 2024-09-01 ENCOUNTER — Other Ambulatory Visit: Payer: Self-pay

## 2024-09-01 DIAGNOSIS — R2689 Other abnormalities of gait and mobility: Secondary | ICD-10-CM | POA: Insufficient documentation

## 2024-09-01 DIAGNOSIS — R293 Abnormal posture: Secondary | ICD-10-CM | POA: Diagnosis not present

## 2024-09-01 DIAGNOSIS — N814 Uterovaginal prolapse, unspecified: Secondary | ICD-10-CM | POA: Diagnosis not present

## 2024-09-01 DIAGNOSIS — M6281 Muscle weakness (generalized): Secondary | ICD-10-CM | POA: Diagnosis not present

## 2024-09-01 NOTE — Therapy (Signed)
 OUTPATIENT PHYSICAL THERAPY FEMALE PELVIC EVALUATION   Patient Name: Becky Lam MRN: [id number] DOB:November 08, 1946, 78 y.o., female Today's Date: 09/01/2024  END OF SESSION:  PT End of Session - 09/01/24 0859     Visit Number 1    Number of Visits 9    Date for Recertification  10/31/24    Authorization Type [employer]    Progress Note Due on Visit 10    PT Start Time 0850    PT Stop Time 0933    PT Time Calculation (min) 43 min    Activity Tolerance Patient tolerated treatment well    Behavior During Therapy WFL for tasks assessed/performed          Past Medical History:  Diagnosis Date   Complication of anesthesia    Dysrhythmia    fast heart beat   PONV (postoperative nausea and vomiting)    AFTER KNEE SURGERY    MAY HAVE BEEN PAIN PILL   Past Surgical History:  Procedure Laterality Date   CATARACT EXTRACTION W/PHACO Left 05/23/2016   Procedure: CATARACT EXTRACTION PHACO AND INTRAOCULAR LENS PLACEMENT (IOC);  Surgeon: Dene Etienne, MD;  Location: [facility];  Service: Ophthalmology;  Laterality: Left;  US   01:13 AP% 17.3 CDE 12.65 fluid pack lot # [id number]   CATARACT EXTRACTION W/PHACO Right 06/21/2016   Procedure: CATARACT EXTRACTION PHACO AND INTRAOCULAR LENS PLACEMENT (IOC) right eye;  Surgeon: Dene Etienne, MD;  Location: [facility];  Service: Ophthalmology;  Laterality: Right;   COLONOSCOPY  03/31/15   FINGER SURGERY Right 2003   middle    KNEE ARTHROSCOPY Left 2013   ROTATOR CUFF REPAIR Left 11/20/2006   Patient Active Problem List   Diagnosis Date Noted   B12 deficiency 11/18/2019   Iron deficiency anemia due to chronic blood loss 11/05/2018   Medicare annual wellness visit, initial 04/05/2017   Breast tenderness in female 01/20/2016   Chronic osteoarthritis 03/15/2015   Essential hypertension 03/15/2015    PCP: Dr. Oneil Pinal    REFERRING PROVIDER: Dr. Oneil Pinal  REFERRING DIAG: PF weakness and cystocele  THERAPY  DIAG:  Muscle weakness (generalized)  Cystocele with prolapse  Other abnormalities of gait and mobility  Abnormal posture  Rationale for Evaluation and Treatment: Rehabilitation  ONSET DATE: 08/19/24 referral date but started months prior   SUBJECTIVE:                                                                                                                                                                                           SUBJECTIVE STATEMENT: Pt reported imaging showed a TIA-unsure when it occurred. Pt goes to  [employer] at least twice a week and walks daily. Pt takes a senior plus with her husband.  URINARY FUNCTION: Pt reports cystocele began around 05/2024, she felt a bulge/pressure and saw her MD. She was put on estrogen cream (twice/week). Pt reported she voids approx. 4-5/day. She's also intermittent fasting per her neurologist (high risk of dementia). Pt rarely experiences urgency UI and no SUI. Pt denied pain with voiding and feels like she's emptying bladder. Pt gets up approx. Twice/night, she's on the CPAP machine. Pt feels bulging when she goes to the bathroom (cystocele) but does not have pain. BOWEL FUNCTION: pt has a BM daily, she uses a stool softener. Pt reports types 3 and 4, 75% of the time. Pt denied pain with BM or hemorrhoids. No fiber or laxatives. CORE STABILITY: Pt denied surgeries. Pt reported she had sciatic nerve pain but had injections for it and goes to the chiropractor. Pt denied MVAs or falls.  SEXUAL FUNCTION: Pt denied hx of pain with intercourse, or tampon insertion or during OBGYN. Pt denied issues with climaxing.   Fluid intake: 64 oz./day on average, sometimes mixes in electrolyte drink, or zevia  FUNCTIONAL LIMITATIONS: none  PERTINENT HISTORY:  Medications for current condition: estrogen cream Surgeries: none Other: Hx of cystocele, OA, iron deficiency anemia, B12 deficiency, dysrhythmia, sees a chiropractor once a month, hx of TIA,  sleep apnea on CPAP Sexual abuse: No   PAIN:  Are you having pain? No NPRS scale: 0/10   PRECAUTIONS: None  RED FLAGS: None   WEIGHT BEARING RESTRICTIONS: No  FALLS:  Has patient fallen in last 6 months? No  OCCUPATION: retired  ACTIVITY LEVEL : goes to the gym twice a week, and walk daily, works in the yard  PLOF: Independent  PATIENT GOALS: to get the bladder back up and core stronger.    BOWEL MOVEMENT: Pain with bowel movement: No Type of bowel movement:Frequency once a day Fully empty rectum: Yes:   Leakage: No                                                     Caused by:  Pads: No Fiber supplement/laxative No but takes a stool softener  URINATION: Pain with urination: No Fully empty bladder: Yes:                                  Post-void dribble: No Stream: Strong Urgency: rarely Frequency:during the day 4-5/day                                                         Nocturia: Yes: twice/night   Leakage: Urge to void Pads/briefs: No  INTERCOURSE:  Ability to have vaginal penetration Yes  Pain with intercourse: none Dryness: No Climax: yes Marinoff Scale: 0/3   PREGNANCY: Number of pregnancies: twice Vaginal deliveries 2 Tearing No Episiotomy No C-section deliveries 0 Currently pregnant No  PROLAPSE: Bulge   OBJECTIVE:  Note: Objective measures were completed at Evaluation unless otherwise noted.   COGNITION: Overall cognitive status: Within functional limits for tasks assessed  SENSATION: Light touch: Appears intact   FUNCTIONAL TESTS:   Single leg stance:  Rt: incr. Postural sway and required hand on wall to maintain balance  Lt: incr. Postural sway and required hand on wall to maintain balance  GAIT: Assistive device utilized: None Comments: decr. Trunk rotation, decr. Stride length  POSTURE: forward head, decreased lumbar lordosis, and increased thoracic kyphosis has to hold wall for balance during R and L  SLS.   LUMBARAROM/PROM: all WFL except for limited R rot.   A/PROM A/PROM  Eval (% available)  Flexion   Extension   Right lateral flexion   Left lateral flexion   Right rotation   Left rotation    (Blank rows = not tested)  LOWER EXTREMITY ROM:  Active ROM Right eval Left eval  Hip flexion    Hip extension    Hip abduction    Hip adduction    Hip internal rotation    Hip external rotation    Knee flexion    Knee extension    Ankle dorsiflexion    Ankle plantarflexion    Ankle inversion    Ankle eversion     (Blank rows = not tested)  LOWER EXTREMITY MMT:  MMT Right eval Left eval  Hip flexion    Hip extension    Hip abduction    Hip adduction    Hip internal rotation    Hip external rotation    Knee flexion    Knee extension    Ankle dorsiflexion    Ankle plantarflexion    Ankle inversion    Ankle eversion     (Blank rows = not tested) PALPATION:  General: no TTP during palpation of spine and SIJ in standing   PELVIC MMT:   MMT eval  Vaginal   Internal Anal Sphincter   External Anal Sphincter   Puborectalis   Diastasis Recti   (Blank rows = not tested)        TONE: limited by time constraints   PROLAPSE: limited by time constraints   TODAY'S TREATMENT:                                                                                                                              DATE: 09/01/24  EVAL  SELF CARE: PATIENT EDUCATION:  Education details: PT educated pt on the benefits of seeing an OBGYN re: potential pessary, as pt stated MD told pt about pessary but not what it entails. She is interested in learning more. PT educated pt on main functions of the pelvic floor, IAP, breath and PFM relationship. PT discussed POC, frequency and duration. PT provided the following education: TOILET POSTURE: Urination: feet flat, lean forward with forearms on legs to fully empty bladder. Bowel movement: place feet flat on Squatty Potty or stool so  knees are higher than hips, lean forward to relax pelvic floor in order to avoid strain.  SHOES: wear supportive shoes, and sandals with  straps.  POSTURE: try not to cross legs at knees or ankles. Try the figure four stretch instead.  WATER: start with water first thing in the morning.   PELVIC TILTS: try to stand in neutral, not tucking your tail and not arching back, but in the middle.  Person educated: Patient Education method: Explanation, Demonstration, and Handouts Education comprehension: verbalized understanding, returned demonstration, and needs further education  HOME EXERCISE PROGRAM: Not yet established.  ASSESSMENT:  CLINICAL IMPRESSION: Patient is a pleasant 78 y.o. female who was seen today for physical therapy evaluation and treatment for pelvic floor weakness and cystocele.  Pt's PMH is significant for the following:  Hx of cystocele, OA, iron deficiency anemia, B12 deficiency, dysrhythmia, sees a chiropractor once a month, hx of TIA, sleep apnea on CPAP. The following impairments were noted upon exam: limited ROM, postural dysfunction, decr. Strength likely 2/2 subjective reports and gait deviations, nocturia, urgency UI. Pt would benefit from skilled PT to improve safety and decr. Pain during all ADLs.   OBJECTIVE IMPAIRMENTS: Abnormal gait, decreased balance, decreased coordination, decreased mobility, decreased ROM, decreased strength, hypomobility, increased fascial restrictions, impaired flexibility, and postural dysfunction.   ACTIVITY LIMITATIONS: carrying, lifting, sleeping, transfers, continence, toileting, locomotion level, and caring for others  PARTICIPATION LIMITATIONS: meal prep, cleaning, laundry, community activity, and yard work  PERSONAL FACTORS: Age, Past/current experiences, and 3+ comorbidities: see above are also affecting patient's functional outcome.   REHAB POTENTIAL: Good  CLINICAL DECISION MAKING: Stable/uncomplicated  EVALUATION  COMPLEXITY: Low   GOALS: Goals reviewed with patient? Yes  SHORT TERM GOALS: Target date: for all STGs: 09/29/24  Pt will be IND in HEP to improve pain, strength, coordination. Baseline: goes to gym class twice a week, walks daily Goal status: INITIAL  2.  Finish exam and write goals as indicated. Baseline:  limited by time constraints Goal status: INITIAL  3.  Pt will demo proper toileting posture to fully empty bladder and reduce straining during bowel movement. Baseline: unable to demo Goal status: INITIAL  LONG TERM GOALS: Target date: for all LTGs: 10/27/24  Pt will demonstrated improved relaxation and contraction of PFM with coordination of breath to reduce urinary leakage to </=once/week. Baseline: rarely experiences urgency urinary incontinence Goal status: INITIAL  2.  Pt will demonstrate improved relaxation and contraction of pelvic floor muscles (PFM) with coordination of breath to decr. Bladder Pressure/bulge during voiding and bowel movements and not incr. cystocele. Baseline: pressure/bulge while voiding/BM Goal status: INITIAL  3.  Pt will report going to the gym for classes to maximize gains made in PT without incr. In cystocele pressure. Baseline: unsure if she's performing exercises correctly Goal status: INITIAL   PLAN: finish exam (palpation, ROM, MMT, DR) Establish HEP.   PT FREQUENCY: 1x/week  PT DURATION: 8 weeks  PLANNED INTERVENTIONS: 97164- PT Re-evaluation, 97110-Therapeutic exercises, 97530- Therapeutic activity, 97112- Neuromuscular re-education, 97535- Self Care, 02859- Manual therapy, [phone]- Gait training, [phone] (1-2 muscles), 20561 (3+ muscles)- Dry Needling, Patient/Family education, Balance training, Taping, Joint mobilization, Spinal mobilization, Cryotherapy, Moist heat, and Biofeedback     Phyllip Claw L, PT 09/01/2024, 10:50 AM  Delon Pinal, PT,DPT 09/01/24 10:50 AM Phone: [phone] Fax: [phone]

## 2024-09-01 NOTE — Patient Instructions (Signed)

## 2024-09-08 ENCOUNTER — Ambulatory Visit

## 2024-09-08 ENCOUNTER — Other Ambulatory Visit: Payer: Self-pay

## 2024-09-08 DIAGNOSIS — R2689 Other abnormalities of gait and mobility: Secondary | ICD-10-CM

## 2024-09-08 DIAGNOSIS — R293 Abnormal posture: Secondary | ICD-10-CM

## 2024-09-08 DIAGNOSIS — M6281 Muscle weakness (generalized): Secondary | ICD-10-CM | POA: Diagnosis not present

## 2024-09-08 DIAGNOSIS — N814 Uterovaginal prolapse, unspecified: Secondary | ICD-10-CM

## 2024-09-08 NOTE — Therapy (Signed)
OUTPATIENT PHYSICAL THERAPY FEMALE PELVIC TREATMENT    Patient Name: Becky Lam MRN: [id number] DOB:June 13, 1946, 78 y.o., female Today's Date: 09/08/2024  END OF SESSION:  PT End of Session - 09/08/24 1540     Visit Number 2    Number of Visits 9    Date for Recertification  10/31/24    Authorization Type [employer]    Progress Note Due on Visit 10    PT Start Time 1537    PT Stop Time 1620    PT Time Calculation (min) 43 min    Activity Tolerance Patient tolerated treatment well    Behavior During Therapy WFL for tasks assessed/performed          Past Medical History:  Diagnosis Date   Complication of anesthesia    Dysrhythmia    fast heart beat   PONV (postoperative nausea and vomiting)    AFTER KNEE SURGERY    MAY HAVE BEEN PAIN PILL   Past Surgical History:  Procedure Laterality Date   CATARACT EXTRACTION W/PHACO Left 05/23/2016   Procedure: CATARACT EXTRACTION PHACO AND INTRAOCULAR LENS PLACEMENT (IOC);  Surgeon: Dene Etienne, MD;  Location: [facility];  Service: Ophthalmology;  Laterality: Left;  US   01:13 AP% 17.3 CDE 12.65 fluid pack lot # [id number]   CATARACT EXTRACTION W/PHACO Right 06/21/2016   Procedure: CATARACT EXTRACTION PHACO AND INTRAOCULAR LENS PLACEMENT (IOC) right eye;  Surgeon: Dene Etienne, MD;  Location: [facility];  Service: Ophthalmology;  Laterality: Right;   COLONOSCOPY  03/31/15   FINGER SURGERY Right 2003   middle    KNEE ARTHROSCOPY Left 2013   ROTATOR CUFF REPAIR Left 11/20/2006   Patient Active Problem List   Diagnosis Date Noted   B12 deficiency 11/18/2019   Iron deficiency anemia due to chronic blood loss 11/05/2018   Medicare annual wellness visit, initial 04/05/2017   Breast tenderness in female 01/20/2016   Chronic osteoarthritis 03/15/2015   Essential hypertension 03/15/2015    PCP: Dr. Oneil Pinal    REFERRING PROVIDER: Dr. Oneil Pinal  REFERRING DIAG: PF weakness and cystocele  THERAPY  DIAG:  Muscle weakness (generalized)  Cystocele with prolapse  Other abnormalities of gait and mobility  Abnormal posture  Rationale for Evaluation and Treatment: Rehabilitation  ONSET DATE: 08/19/24 referral date but started months prior   SUBJECTIVE:                                                                                                                                                                                           SUBJECTIVE STATEMENT: Pt has a bookcase vs. A squatty potty, toileting posture, and  overall posture. She also has an appt. With OBGYN tomorrow.  EVAL:  URINARY FUNCTION: Pt reports cystocele began around 05/2024, she felt a bulge/pressure and saw her MD. She was put on estrogen cream (twice/week). Pt reported she voids approx. 4-5/day. She's also intermittent fasting per her neurologist (high risk of dementia). Pt rarely experiences urgency UI and no SUI. Pt denied pain with voiding and feels like she's emptying bladder. Pt gets up approx. Twice/night, she's on the CPAP machine. Pt feels bulging when she goes to the bathroom (cystocele) but does not have pain. BOWEL FUNCTION: pt has a BM daily, she uses a stool softener. Pt reports types 3 and 4, 75% of the time. Pt denied pain with BM or hemorrhoids. No fiber or laxatives. CORE STABILITY: Pt denied surgeries. Pt reported she had sciatic nerve pain but had injections for it and goes to the chiropractor. Pt denied MVAs or falls.  SEXUAL FUNCTION: Pt denied hx of pain with intercourse, or tampon insertion or during OBGYN. Pt denied issues with climaxing.   Fluid intake: 64 oz./day on average, sometimes mixes in electrolyte drink, or zevia  FUNCTIONAL LIMITATIONS: none  PERTINENT HISTORY:  Medications for current condition: estrogen cream Surgeries: none Other: Hx of cystocele, OA, iron deficiency anemia, B12 deficiency, dysrhythmia, sees a chiropractor once a month, hx of TIA, sleep apnea on CPAP Sexual abuse:  No   PAIN:  Are you having pain? No 09/08/24 NPRS scale: 0/10   PRECAUTIONS: None  RED FLAGS: None   WEIGHT BEARING RESTRICTIONS: No  FALLS:  Has patient fallen in last 6 months? No  OCCUPATION: retired  ACTIVITY LEVEL : goes to the gym twice a week, and walk daily, works in the yard  PLOF: Independent  PATIENT GOALS: to get the bladder back up and core stronger.    BOWEL MOVEMENT: Pain with bowel movement: No Type of bowel movement:Frequency once a day Fully empty rectum: Yes:   Leakage: No                                                     Caused by:  Pads: No Fiber supplement/laxative No but takes a stool softener  URINATION: Pain with urination: No Fully empty bladder: Yes:                                  Post-void dribble: No Stream: Strong Urgency: rarely Frequency:during the day 4-5/day                                                         Nocturia: Yes: twice/night   Leakage: Urge to void Pads/briefs: No  INTERCOURSE:  Ability to have vaginal penetration Yes  Pain with intercourse: none Dryness: No Climax: yes Marinoff Scale: 0/3   PREGNANCY: Number of pregnancies: twice Vaginal deliveries 2 Tearing No Episiotomy No C-section deliveries 0 Currently pregnant No  PROLAPSE: Bulge   OBJECTIVE:  Note: Objective measures were completed at Evaluation unless otherwise noted.   COGNITION: Overall cognitive status: Within functional limits for tasks assessed     SENSATION: Light touch: Appears  intact   FUNCTIONAL TESTS:   Single leg stance:  Rt: incr. Postural sway and required hand on wall to maintain balance  Lt: incr. Postural sway and required hand on wall to maintain balance  GAIT: Assistive device utilized: None Comments: decr. Trunk rotation, decr. Stride length  POSTURE: forward head, decreased lumbar lordosis, and increased thoracic kyphosis has to hold wall for balance during R and L SLS.   LUMBARAROM/PROM: all WFL  except for limited R rot.   A/PROM A/PROM  Eval (% available)  Flexion   Extension   Right lateral flexion   Left lateral flexion   Right rotation   Left rotation    (Blank rows = not tested)  LOWER EXTREMITY MMT:  Active MMT Right eval Left eval  Hip flexion 4 4  Hip extension    Hip abduction 3+ 3+  Hip adduction 4- 4-  /Hip internal rotation 5 5  Hip external rotation 5 5  Knee flexion 4- 4-  Knee extension 5 5  Ankle dorsiflexion 5 5  Ankle plantarflexion    Ankle inversion    Ankle eversion     (Blank rows = not tested)  LOWER EXTREMITY ROM: WNL except for B hip IR   MMT Right eval Left eval  Hip flexion    Hip extension    Hip abduction    Hip adduction    Hip internal rotation    Hip external rotation    Knee flexion    Knee extension    Ankle dorsiflexion    Ankle plantarflexion    Ankle inversion    Ankle eversion     (Blank rows = not tested) PALPATION:  General: no TTP during palpation of spine and SIJ in standing 9/29: post. Pelvic tilt, incr. B glute/hip rotator tension noted but no TTP reported. Incr. Tension in B hamstrings and R diaphragm vs. L diaphragm. No DR noted. Limited B hip IR and hypomobility of sacrum and tx spine. Difficulty determining if pt performing PFM contraction and relaxation correctly.     PELVIC MMT:   MMT eval  Vaginal   Internal Anal Sphincter   External Anal Sphincter   Puborectalis   Diastasis Recti   (Blank rows = not tested)        TONE: Incr. Tension in post. Musculature with hx of sciatica per pt.   PROLAPSE: Cystocele per MD.   TODAY'S TREATMENT:                                                                                                                              DATE: 09/08/24   Physical function test: PT completed exam (palpation, MMT, DR, ROM). See above for details.  NMR:  Access Code: [email or web address] URL: [email or web address] Date: 09/08/2024 Prepared by: Delon Pinal  Exercises - Supine Angels  - 1 x daily - 7 x weekly - 1 sets - 10 reps - Sidelying Diaphragmatic Breathing  - 1 x  daily - 7 x weekly - 1 sets - 5 reps performed in supine as well. - Sidelying Open Book  - 1 x daily - 7 x weekly - 1 sets - 10 reps Cues and demo for proper technique. S for safety. No pain reported.  SELF CARE: PATIENT EDUCATION:  Education details: PT educated pt on exam findings, the importance of mobility to incr. Diaphragm expansion to decr. IAP and tension of PFM and how it can cause prolapse. Demonstrated lying on stomach with pillow under lower LEs to protect back, in order to reduce pressure from cystocele at end of day or after bowel movement.  Person educated: Patient Education method: Explanation, Demonstration, and Handouts Education comprehension: verbalized understanding, returned demonstration, and needs further education  HOME EXERCISE PROGRAM: [email or web address]  ASSESSMENT:  CLINICAL IMPRESSION: Skilled session focused on completing exam (difficulty coordinating breath with PFM contraction and relaxation, decr. Post/lat Rib translation with inhale, incr. Post. Musculature tensio, hip weakness, and hypomobility of sacrum and tx spine). Difficulty regulating IAP is likely causing cystocele and will take an inter-regional approach to reduce pressure. The following impairments continue to be noted: limited ROM, postural dysfunction, decr. Strength likely 2/2 subjective reports and gait deviations, nocturia, urgency UI. Pt would continue to benefit from skilled PT to improve safety and decr. Pain during all ADLs.   OBJECTIVE IMPAIRMENTS: Abnormal gait, decreased balance, decreased coordination, decreased mobility, decreased ROM, decreased strength, hypomobility, increased fascial restrictions, impaired flexibility, and postural dysfunction.   ACTIVITY LIMITATIONS: carrying, lifting, sleeping, transfers, continence, toileting, locomotion level, and caring  for others  PARTICIPATION LIMITATIONS: meal prep, cleaning, laundry, community activity, and yard work  PERSONAL FACTORS: Age, Past/current experiences, and 3+ comorbidities: see above are also affecting patient's functional outcome.   REHAB POTENTIAL: Good  CLINICAL DECISION MAKING: Stable/uncomplicated  EVALUATION COMPLEXITY: Low   GOALS: Goals reviewed with patient? Yes  SHORT TERM GOALS: Target date: for all STGs: 09/29/24  Pt will be IND in HEP to improve pain, strength, coordination. Baseline: goes to gym class twice a week, walks daily Goal status: INITIAL  2.  Finish exam and write goals as indicated. Baseline:  limited by time constraints Goal status: MET  3.  Pt will demo proper toileting posture to fully empty bladder and reduce straining during bowel movement. Baseline: unable to demo Goal status: INITIAL  LONG TERM GOALS: Target date: for all LTGs: 10/27/24  Pt will demonstrated improved relaxation and contraction of PFM with coordination of breath to reduce urinary leakage to </=once/week. Baseline: rarely experiences urgency urinary incontinence Goal status: INITIAL  2.  Pt will demonstrate improved relaxation and contraction of pelvic floor muscles (PFM) with coordination of breath to decr. Bladder Pressure/bulge during voiding and bowel movements and not incr. cystocele. Baseline: pressure/bulge while voiding/BM Goal status: INITIAL  3.  Pt will report going to the gym for classes to maximize gains made in PT without incr. In cystocele pressure. Baseline: unsure if she's performing exercises correctly Goal status: INITIAL   PLAN: Review HEP , internal assessment per discussion, manual therapy, strengthening.  PT FREQUENCY: 1x/week  PT DURATION: 8 weeks  PLANNED INTERVENTIONS: 97164- PT Re-evaluation, 97110-Therapeutic exercises, 97530- Therapeutic activity, 97112- Neuromuscular re-education, 97535- Self Care, 02859- Manual therapy, [phone]- Gait  training, [phone] (1-2 muscles), 20561 (3+ muscles)- Dry Needling, Patient/Family education, Balance training, Taping, Joint mobilization, Spinal mobilization, Cryotherapy, Moist heat, and Biofeedback     Thermon Zulauf L, PT 09/08/2024, 4:31 PM  Delon Pinal, PT,DPT 09/08/24 4:31 PM Phone: [phone] Fax:  [phone]   

## 2024-09-09 DIAGNOSIS — N8111 Cystocele, midline: Secondary | ICD-10-CM | POA: Diagnosis not present

## 2024-09-09 DIAGNOSIS — M5136 Other intervertebral disc degeneration, lumbar region with discogenic back pain only: Secondary | ICD-10-CM | POA: Diagnosis not present

## 2024-09-09 DIAGNOSIS — N898 Other specified noninflammatory disorders of vagina: Secondary | ICD-10-CM | POA: Diagnosis not present

## 2024-09-09 DIAGNOSIS — M9901 Segmental and somatic dysfunction of cervical region: Secondary | ICD-10-CM | POA: Diagnosis not present

## 2024-09-09 DIAGNOSIS — N814 Uterovaginal prolapse, unspecified: Secondary | ICD-10-CM | POA: Diagnosis not present

## 2024-09-09 DIAGNOSIS — Z4689 Encounter for fitting and adjustment of other specified devices: Secondary | ICD-10-CM | POA: Diagnosis not present

## 2024-09-09 DIAGNOSIS — M9903 Segmental and somatic dysfunction of lumbar region: Secondary | ICD-10-CM | POA: Diagnosis not present

## 2024-09-09 DIAGNOSIS — M62830 Muscle spasm of back: Secondary | ICD-10-CM | POA: Diagnosis not present

## 2024-09-15 ENCOUNTER — Other Ambulatory Visit: Payer: Self-pay

## 2024-09-15 ENCOUNTER — Ambulatory Visit: Attending: Internal Medicine

## 2024-09-15 DIAGNOSIS — N814 Uterovaginal prolapse, unspecified: Secondary | ICD-10-CM | POA: Insufficient documentation

## 2024-09-15 DIAGNOSIS — G4733 Obstructive sleep apnea (adult) (pediatric): Secondary | ICD-10-CM | POA: Diagnosis not present

## 2024-09-15 DIAGNOSIS — R293 Abnormal posture: Secondary | ICD-10-CM | POA: Insufficient documentation

## 2024-09-15 DIAGNOSIS — R2689 Other abnormalities of gait and mobility: Secondary | ICD-10-CM | POA: Diagnosis not present

## 2024-09-15 DIAGNOSIS — M6281 Muscle weakness (generalized): Secondary | ICD-10-CM | POA: Insufficient documentation

## 2024-09-15 NOTE — Therapy (Signed)
 OUTPATIENT PHYSICAL THERAPY FEMALE PELVIC TREATMENT    Patient Name: Becky Lam MRN: [id number] DOB:12/02/1946, 78 y.o., female Today's Date: 09/15/2024  END OF SESSION:  PT End of Session - 09/15/24 1404     Visit Number 3    Number of Visits 9    Date for Recertification  10/31/24    Authorization Type [employer]    Progress Note Due on Visit 10    PT Start Time 1402    PT Stop Time 1448    PT Time Calculation (min) 46 min    Activity Tolerance Patient tolerated treatment well    Behavior During Therapy WFL for tasks assessed/performed          Past Medical History:  Diagnosis Date   Complication of anesthesia    Dysrhythmia    fast heart beat   PONV (postoperative nausea and vomiting)    AFTER KNEE SURGERY    MAY HAVE BEEN PAIN PILL   Past Surgical History:  Procedure Laterality Date   CATARACT EXTRACTION W/PHACO Left 05/23/2016   Procedure: CATARACT EXTRACTION PHACO AND INTRAOCULAR LENS PLACEMENT (IOC);  Surgeon: Dene Etienne, MD;  Location: [facility];  Service: Ophthalmology;  Laterality: Left;  US   01:13 AP% 17.3 CDE 12.65 fluid pack lot # [id number]   CATARACT EXTRACTION W/PHACO Right 06/21/2016   Procedure: CATARACT EXTRACTION PHACO AND INTRAOCULAR LENS PLACEMENT (IOC) right eye;  Surgeon: Dene Etienne, MD;  Location: [facility];  Service: Ophthalmology;  Laterality: Right;   COLONOSCOPY  03/31/15   FINGER SURGERY Right 2003   middle    KNEE ARTHROSCOPY Left 2013   ROTATOR CUFF REPAIR Left 11/20/2006   Patient Active Problem List   Diagnosis Date Noted   B12 deficiency 11/18/2019   Iron deficiency anemia due to chronic blood loss 11/05/2018   Medicare annual wellness visit, initial 04/05/2017   Breast tenderness in female 01/20/2016   Chronic osteoarthritis 03/15/2015   Essential hypertension 03/15/2015    PCP: Dr. Oneil Pinal    REFERRING PROVIDER: Dr. Oneil Pinal  REFERRING DIAG: PF weakness and cystocele  THERAPY  DIAG:  Muscle weakness (generalized)  Cystocele with prolapse  Other abnormalities of gait and mobility  Abnormal posture  Rationale for Evaluation and Treatment: Rehabilitation  ONSET DATE: 08/19/24 referral date but started months prior   SUBJECTIVE:                                                                                                                                                                                           SUBJECTIVE STATEMENT: Pt had an appt with OBGYN last week to discuss options  for cystocele and was told her cervix was also lower, she also had bacterial infection and yeast infection. She was able to place the pessary but is concerned about removal of it and how to clean it.   EVAL:  URINARY FUNCTION: Pt reports cystocele began around 05/2024, she felt a bulge/pressure and saw her MD. She was put on estrogen cream (twice/week). Pt reported she voids approx. 4-5/day. She's also intermittent fasting per her neurologist (high risk of dementia). Pt rarely experiences urgency UI and no SUI. Pt denied pain with voiding and feels like she's emptying bladder. Pt gets up approx. Twice/night, she's on the CPAP machine. Pt feels bulging when she goes to the bathroom (cystocele) but does not have pain. BOWEL FUNCTION: pt has a BM daily, she uses a stool softener. Pt reports types 3 and 4, 75% of the time. Pt denied pain with BM or hemorrhoids. No fiber or laxatives. CORE STABILITY: Pt denied surgeries. Pt reported she had sciatic nerve pain but had injections for it and goes to the chiropractor. Pt denied MVAs or falls.  SEXUAL FUNCTION: Pt denied hx of pain with intercourse, or tampon insertion or during OBGYN. Pt denied issues with climaxing.   Fluid intake: 64 oz./day on average, sometimes mixes in electrolyte drink, or zevia  FUNCTIONAL LIMITATIONS: none  PERTINENT HISTORY:  Medications for current condition: estrogen cream Surgeries: none Other: Hx of cystocele,  OA, iron deficiency anemia, B12 deficiency, dysrhythmia, sees a chiropractor once a month, hx of TIA, sleep apnea on CPAP Sexual abuse: No   PAIN:  Are you having pain? No 09/08/24 NPRS scale: 0/10   PRECAUTIONS: None  RED FLAGS: None   WEIGHT BEARING RESTRICTIONS: No  FALLS:  Has patient fallen in last 6 months? No  OCCUPATION: retired  ACTIVITY LEVEL : goes to the gym twice a week, and walk daily, works in the yard  PLOF: Independent  PATIENT GOALS: to get the bladder back up and core stronger.    BOWEL MOVEMENT: Pain with bowel movement: No Type of bowel movement:Frequency once a day Fully empty rectum: Yes:   Leakage: No                                                     Caused by:  Pads: No Fiber supplement/laxative No but takes a stool softener  URINATION: Pain with urination: No Fully empty bladder: Yes:                                  Post-void dribble: No Stream: Strong Urgency: rarely Frequency:during the day 4-5/day                                                         Nocturia: Yes: twice/night   Leakage: Urge to void Pads/briefs: No  INTERCOURSE:  Ability to have vaginal penetration Yes  Pain with intercourse: none Dryness: No Climax: yes Marinoff Scale: 0/3   PREGNANCY: Number of pregnancies: twice Vaginal deliveries 2 Tearing No Episiotomy No C-section deliveries 0 Currently pregnant No  PROLAPSE: Bulge   OBJECTIVE:  Note: Objective  measures were completed at Evaluation unless otherwise noted.   COGNITION: Overall cognitive status: Within functional limits for tasks assessed     SENSATION: Light touch: Appears intact   FUNCTIONAL TESTS:   Single leg stance:  Rt: incr. Postural sway and required hand on wall to maintain balance  Lt: incr. Postural sway and required hand on wall to maintain balance  GAIT: Assistive device utilized: None Comments: decr. Trunk rotation, decr. Stride length  POSTURE: forward head,  decreased lumbar lordosis, and increased thoracic kyphosis has to hold wall for balance during R and L SLS.   LUMBARAROM/PROM: all WFL except for limited R rot.   A/PROM A/PROM  Eval (% available)  Flexion   Extension   Right lateral flexion   Left lateral flexion   Right rotation   Left rotation    (Blank rows = not tested)  LOWER EXTREMITY MMT:  Active MMT Right eval Left eval  Hip flexion 4 4  Hip extension    Hip abduction 3+ 3+  Hip adduction 4- 4-  /Hip internal rotation 5 5  Hip external rotation 5 5  Knee flexion 4- 4-  Knee extension 5 5  Ankle dorsiflexion 5 5  Ankle plantarflexion    Ankle inversion    Ankle eversion     (Blank rows = not tested)  LOWER EXTREMITY ROM: WNL except for B hip IR   MMT Right eval Left eval  Hip flexion    Hip extension    Hip abduction    Hip adduction    Hip internal rotation    Hip external rotation    Knee flexion    Knee extension    Ankle dorsiflexion    Ankle plantarflexion    Ankle inversion    Ankle eversion     (Blank rows = not tested) PALPATION:  General: no TTP during palpation of spine and SIJ in standing 9/29: post. Pelvic tilt, incr. B glute/hip rotator tension noted but no TTP reported. Incr. Tension in B hamstrings and R diaphragm vs. L diaphragm. No DR noted. Limited B hip IR and hypomobility of sacrum and tx spine. Difficulty determining if pt performing PFM contraction and relaxation correctly.     PELVIC MMT:   MMT eval  Vaginal   Internal Anal Sphincter   External Anal Sphincter   Puborectalis   Diastasis Recti   (Blank rows = not tested)        TONE: Incr. Tension in post. Musculature with hx of sciatica per pt.   PROLAPSE: Cystocele per MD.   CATHRINE TREATMENT:                                                                                                                              DATE: 09/15/24    NMR:  Access Code: [email or web address] URL:  [email or web address] Date: 09/15/2024 Prepared by: Delon Pinal  Exercises -Clams with and without band x10reps/LE-not added as too easy - Bridge  with Hip Abduction and Resistance - red band- 1 x daily - 3 x weekly - 3 sets - 10 reps - Dead Bug  - 1 x daily - 3 x weekly - 3 sets - 10 reps cues for positioning and breath coordination and TrA activation - Bird Dog  - 1 x daily - 3 x weekly - 1 sets - 10 reps with opposite LE/UE and then one UE at a time and then one LE at a time with toes in contact with mat to decr. Trunk/hip movement and adding TrA activation - Bear Plank from Quadruped  - 1 x daily - 3 x weekly - 1 sets - 3 reps - 15-30 hold Cues and demo for proper technique. S for safety. No pain reported.  SELF CARE: PATIENT EDUCATION:  Education details: PT educated pt on asking MD about cleaning of pessary, when to wear it and surgical options. Progressed HEP to improve strength and decr. IAP on PFM. Person educated: Patient Education method: Explanation, Demonstration, and Handouts Education comprehension: verbalized understanding, returned demonstration, and needs further education  HOME EXERCISE PROGRAM: [email or web address]  ASSESSMENT:  CLINICAL IMPRESSION: Skilled session focused on progressing strengthening with TrA activation to regulate IAP, as incr. IAP can incr. Pressure on PFM and lead to leakage, prolapse and pain. Pt quickly progressed to performing HEP IND after cues and demo. The following impairments continue to be noted: limited ROM, postural dysfunction, decr. Strength likely 2/2 subjective reports and gait deviations, nocturia, urgency UI. Pt would continue to benefit from skilled PT to improve safety and decr. Pain during all ADLs.   OBJECTIVE IMPAIRMENTS: Abnormal gait, decreased balance, decreased coordination, decreased mobility, decreased ROM, decreased strength, hypomobility, increased fascial restrictions, impaired flexibility, and postural  dysfunction.   ACTIVITY LIMITATIONS: carrying, lifting, sleeping, transfers, continence, toileting, locomotion level, and caring for others  PARTICIPATION LIMITATIONS: meal prep, cleaning, laundry, community activity, and yard work  PERSONAL FACTORS: Age, Past/current experiences, and 3+ comorbidities: see above are also affecting patient's functional outcome.   REHAB POTENTIAL: Good  CLINICAL DECISION MAKING: Stable/uncomplicated  EVALUATION COMPLEXITY: Low   GOALS: Goals reviewed with patient? Yes  SHORT TERM GOALS: Target date: for all STGs: 09/29/24  Pt will be IND in HEP to improve pain, strength, coordination. Baseline: goes to gym class twice a week, walks daily Goal status: INITIAL  2.  Finish exam and write goals as indicated. Baseline:  limited by time constraints Goal status: MET  3.  Pt will demo proper toileting posture to fully empty bladder and reduce straining during bowel movement. Baseline: unable to demo Goal status: INITIAL  LONG TERM GOALS: Target date: for all LTGs: 10/27/24  Pt will demonstrated improved relaxation and contraction of PFM with coordination of breath to reduce urinary leakage to </=once/week. Baseline: rarely experiences urgency urinary incontinence Goal status: INITIAL  2.  Pt will demonstrate improved relaxation and contraction of pelvic floor muscles (PFM) with coordination of breath to decr. Bladder Pressure/bulge during voiding and bowel movements and not incr. cystocele. Baseline: pressure/bulge while voiding/BM Goal status: INITIAL  3.  Pt will report going to the gym for classes to maximize gains made in PT without incr. In cystocele pressure. Baseline: unsure if she's performing exercises correctly Goal status: INITIAL   PLAN: Review HEP and progress , internal assessment per discussion, manual therapy, strengthening.  PT FREQUENCY: 1x/week  PT DURATION: 8 weeks  PLANNED INTERVENTIONS: 97164- PT Re-evaluation,  97110-Therapeutic exercises, 97530- Therapeutic activity, [id number]- Neuromuscular re-education, 97535- Self Care,  02859- Manual therapy, [id number]- Gait training, 79439 (1-2 muscles), 20561 (3+ muscles)- Dry Needling, Patient/Family education, Balance training, Taping, Joint mobilization, Spinal mobilization, Cryotherapy, Moist heat, and Biofeedback     Zaydah Nawabi L, PT 09/15/2024, 2:53 PM  Delon Pinal, PT,DPT 09/15/24 2:53 PM Phone: [phone] Fax: [phone]

## 2024-09-22 ENCOUNTER — Other Ambulatory Visit: Payer: Self-pay

## 2024-09-22 ENCOUNTER — Ambulatory Visit

## 2024-09-22 DIAGNOSIS — R2689 Other abnormalities of gait and mobility: Secondary | ICD-10-CM

## 2024-09-22 DIAGNOSIS — M6281 Muscle weakness (generalized): Secondary | ICD-10-CM | POA: Diagnosis not present

## 2024-09-22 DIAGNOSIS — R293 Abnormal posture: Secondary | ICD-10-CM

## 2024-09-22 DIAGNOSIS — N814 Uterovaginal prolapse, unspecified: Secondary | ICD-10-CM

## 2024-09-22 NOTE — Therapy (Signed)
 OUTPATIENT PHYSICAL THERAPY FEMALE PELVIC TREATMENT    Patient Name: Becky Lam MRN: [id number] DOB:Jul 26, 1946, 78 y.o., female Today's Date: 09/22/2024  END OF SESSION:  PT End of Session - 09/22/24 1538     Visit Number 4    Number of Visits 9    Date for Recertification  10/31/24    Authorization Type [employer]    Progress Note Due on Visit 10    PT Start Time 1537    PT Stop Time 1623    PT Time Calculation (min) 46 min    Activity Tolerance Patient tolerated treatment well    Behavior During Therapy WFL for tasks assessed/performed          Past Medical History:  Diagnosis Date   Complication of anesthesia    Dysrhythmia    fast heart beat   PONV (postoperative nausea and vomiting)    AFTER KNEE SURGERY    MAY HAVE BEEN PAIN PILL   Past Surgical History:  Procedure Laterality Date   CATARACT EXTRACTION W/PHACO Left 05/23/2016   Procedure: CATARACT EXTRACTION PHACO AND INTRAOCULAR LENS PLACEMENT (IOC);  Surgeon: Dene Etienne, MD;  Location: [facility];  Service: Ophthalmology;  Laterality: Left;  US   01:13 AP% 17.3 CDE 12.65 fluid pack lot # [id number]   CATARACT EXTRACTION W/PHACO Right 06/21/2016   Procedure: CATARACT EXTRACTION PHACO AND INTRAOCULAR LENS PLACEMENT (IOC) right eye;  Surgeon: Dene Etienne, MD;  Location: [facility];  Service: Ophthalmology;  Laterality: Right;   COLONOSCOPY  03/31/15   FINGER SURGERY Right 2003   middle    KNEE ARTHROSCOPY Left 2013   ROTATOR CUFF REPAIR Left 11/20/2006   Patient Active Problem List   Diagnosis Date Noted   B12 deficiency 11/18/2019   Iron deficiency anemia due to chronic blood loss 11/05/2018   Medicare annual wellness visit, initial 04/05/2017   Breast tenderness in female 01/20/2016   Chronic osteoarthritis 03/15/2015   Essential hypertension 03/15/2015    PCP: Dr. Oneil Pinal    REFERRING PROVIDER: Dr. Oneil Pinal  REFERRING DIAG: PF weakness and cystocele  THERAPY  DIAG:  Muscle weakness (generalized)  Cystocele with prolapse  Other abnormalities of gait and mobility  Abnormal posture  Rationale for Evaluation and Treatment: Rehabilitation  ONSET DATE: 08/19/24 referral date but started months prior   SUBJECTIVE:                                                                                                                                                                                           SUBJECTIVE STATEMENT: Pt reported a little bit of bleeding from vaginal canal this  last week but no pain. She was wondering if she could just do PT and not pessary or surgery. Pt reported exercises are going well, and feels like cystocele goes back in during exercises. PT demonstrated lying prone to decr. Pressure as well.  EVAL:  URINARY FUNCTION: Pt reports cystocele began around 05/2024, she felt a bulge/pressure and saw her MD. She was put on estrogen cream (twice/week). Pt reported she voids approx. 4-5/day. She's also intermittent fasting per her neurologist (high risk of dementia). Pt rarely experiences urgency UI and no SUI. Pt denied pain with voiding and feels like she's emptying bladder. Pt gets up approx. Twice/night, she's on the CPAP machine. Pt feels bulging when she goes to the bathroom (cystocele) but does not have pain. BOWEL FUNCTION: pt has a BM daily, she uses a stool softener. Pt reports types 3 and 4, 75% of the time. Pt denied pain with BM or hemorrhoids. No fiber or laxatives. CORE STABILITY: Pt denied surgeries. Pt reported she had sciatic nerve pain but had injections for it and goes to the chiropractor. Pt denied MVAs or falls.  SEXUAL FUNCTION: Pt denied hx of pain with intercourse, or tampon insertion or during OBGYN. Pt denied issues with climaxing.   Fluid intake: 64 oz./day on average, sometimes mixes in electrolyte drink, or zevia  FUNCTIONAL LIMITATIONS: none  PERTINENT HISTORY:  Medications for current condition: estrogen  cream Surgeries: none Other: Hx of cystocele, OA, iron deficiency anemia, B12 deficiency, dysrhythmia, sees a chiropractor once a month, hx of TIA, sleep apnea on CPAP Sexual abuse: No   PAIN:  Are you having pain? No 09/22/24 NPRS scale: 0/10   PRECAUTIONS: None  RED FLAGS: None   WEIGHT BEARING RESTRICTIONS: No  FALLS:  Has patient fallen in last 6 months? No  OCCUPATION: retired  ACTIVITY LEVEL : goes to the gym twice a week, and walk daily, works in the yard  PLOF: Independent  PATIENT GOALS: to get the bladder back up and core stronger.    BOWEL MOVEMENT: Pain with bowel movement: No Type of bowel movement:Frequency once a day Fully empty rectum: Yes:   Leakage: No                                                     Caused by:  Pads: No Fiber supplement/laxative No but takes a stool softener  URINATION: Pain with urination: No Fully empty bladder: Yes:                                  Post-void dribble: No Stream: Strong Urgency: rarely Frequency:during the day 4-5/day                                                         Nocturia: Yes: twice/night   Leakage: Urge to void Pads/briefs: No  INTERCOURSE:  Ability to have vaginal penetration Yes  Pain with intercourse: none Dryness: No Climax: yes Marinoff Scale: 0/3   PREGNANCY: Number of pregnancies: twice Vaginal deliveries 2 Tearing No Episiotomy No C-section deliveries 0 Currently pregnant No  PROLAPSE: Bulge  OBJECTIVE:  Note: Objective measures were completed at Evaluation unless otherwise noted.   COGNITION: Overall cognitive status: Within functional limits for tasks assessed     SENSATION: Light touch: Appears intact   FUNCTIONAL TESTS:   Single leg stance:  Rt: incr. Postural sway and required hand on wall to maintain balance  Lt: incr. Postural sway and required hand on wall to maintain balance  GAIT: Assistive device utilized: None Comments: decr. Trunk rotation,  decr. Stride length  POSTURE: forward head, decreased lumbar lordosis, and increased thoracic kyphosis has to hold wall for balance during R and L SLS.   LUMBARAROM/PROM: all WFL except for limited R rot.   A/PROM A/PROM  Eval (% available)  Flexion   Extension   Right lateral flexion   Left lateral flexion   Right rotation   Left rotation    (Blank rows = not tested)  LOWER EXTREMITY MMT:  Active MMT Right eval Left eval  Hip flexion 4 4  Hip extension    Hip abduction 3+ 3+  Hip adduction 4- 4-  /Hip internal rotation 5 5  Hip external rotation 5 5  Knee flexion 4- 4-  Knee extension 5 5  Ankle dorsiflexion 5 5  Ankle plantarflexion    Ankle inversion    Ankle eversion     (Blank rows = not tested)  LOWER EXTREMITY ROM: WNL except for B hip IR   MMT Right eval Left eval  Hip flexion    Hip extension    Hip abduction    Hip adduction    Hip internal rotation    Hip external rotation    Knee flexion    Knee extension    Ankle dorsiflexion    Ankle plantarflexion    Ankle inversion    Ankle eversion     (Blank rows = not tested) PALPATION:  General: no TTP during palpation of spine and SIJ in standing 9/29: post. Pelvic tilt, incr. B glute/hip rotator tension noted but no TTP reported. Incr. Tension in B hamstrings and R diaphragm vs. L diaphragm. No DR noted. Limited B hip IR and hypomobility of sacrum and tx spine. Difficulty determining if pt performing PFM contraction and relaxation correctly.     PELVIC MMT:   MMT eval  Vaginal   Internal Anal Sphincter   External Anal Sphincter   Puborectalis   Diastasis Recti   (Blank rows = not tested)        TONE: Incr. Tension in post. Musculature with hx of sciatica per pt.   PROLAPSE: Cystocele per MD.   TODAY'S TREATMENT:                                                                                                                              DATE: 09/22/24   THEREX: Pt performed guided  Peloton barre class to improve strength and endurance, with PT instructing pt on modifications.  -Mini squats in hip ER  and then //, full squats two count down, two count up, one count, then pulsing squat, heel raises with knee ext and then knee flex, hip ext with knee flexed and toe pointed, in sidelying: clams, side planks, hip add with toes in plantarflexion and dorsiflexion, ipsilat dead bug with yoga block on contralateral side b/t knee and elbow, hip abd, hip ext in quadruped (demo only). Number of sets and reps varied from 5-30 reps.  Performed at counter with hand support as needed for safety. Cues and demo for proper technique. S for safety. No pain reported.  SELF CARE: PATIENT EDUCATION:  Education details: PT educated pt utilizing peloton app to perform barre class at counter for safety and using blankets under knees and modify barre v and crunches with dead bug to reduce IAP and to perform once a week on Saturday.  Person educated: Patient Education method: Explanation, Demonstration, and Handouts Education comprehension: verbalized understanding, returned demonstration, and needs further education  HOME EXERCISE PROGRAM: [email or web address]  ASSESSMENT:  CLINICAL IMPRESSION: Skilled session focused on progressing endurance and strength, while managing IAP in standing, supine, quadruped and sidelying. Pt demonstrated progress as she was able to perform all activities without incr. In pressure or pain or rest breaks.The following impairments continue to be noted: limited ROM, postural dysfunction, decr. Strength likely 2/2 subjective reports and gait deviations, nocturia, urgency UI. Pt would continue to benefit from skilled PT to improve safety and decr. Pain during all ADLs.   OBJECTIVE IMPAIRMENTS: Abnormal gait, decreased balance, decreased coordination, decreased mobility, decreased ROM, decreased strength, hypomobility, increased fascial restrictions, impaired flexibility, and  postural dysfunction.   ACTIVITY LIMITATIONS: carrying, lifting, sleeping, transfers, continence, toileting, locomotion level, and caring for others  PARTICIPATION LIMITATIONS: meal prep, cleaning, laundry, community activity, and yard work  PERSONAL FACTORS: Age, Past/current experiences, and 3+ comorbidities: see above are also affecting patient's functional outcome.   REHAB POTENTIAL: Good  CLINICAL DECISION MAKING: Stable/uncomplicated  EVALUATION COMPLEXITY: Low   GOALS: Goals reviewed with patient? Yes  SHORT TERM GOALS: Target date: for all STGs: 09/29/24  Pt will be IND in HEP to improve pain, strength, coordination. Baseline: goes to gym class twice a week, walks daily Goal status: INITIAL  2.  Finish exam and write goals as indicated. Baseline:  limited by time constraints Goal status: MET  3.  Pt will demo proper toileting posture to fully empty bladder and reduce straining during bowel movement. Baseline: unable to demo Goal status: INITIAL  LONG TERM GOALS: Target date: for all LTGs: 10/27/24  Pt will demonstrated improved relaxation and contraction of PFM with coordination of breath to reduce urinary leakage to </=once/week. Baseline: rarely experiences urgency urinary incontinence Goal status: INITIAL  2.  Pt will demonstrate improved relaxation and contraction of pelvic floor muscles (PFM) with coordination of breath to decr. Bladder Pressure/bulge during voiding and bowel movements and not incr. cystocele. Baseline: pressure/bulge while voiding/BM Goal status: INITIAL  3.  Pt will report going to the gym for classes to maximize gains made in PT without incr. In cystocele pressure. Baseline: unsure if she's performing exercises correctly Goal status: INITIAL   PLAN: check STGs, internal assessment per discussion, manual therapy, strengthening.  PT FREQUENCY: 1x/week  PT DURATION: 8 weeks  PLANNED INTERVENTIONS: 97164- PT Re-evaluation,  97110-Therapeutic exercises, 97530- Therapeutic activity, 97112- Neuromuscular re-education, 97535- Self Care, 02859- Manual therapy, [phone]- Gait training, [phone] (1-2 muscles), 20561 (3+ muscles)- Dry Needling, Patient/Family education, Balance training, Taping, Joint mobilization, Spinal mobilization, Cryotherapy, Moist  heat, and Biofeedback     Liller Yohn L, PT 09/22/2024, 4:32 PM  Delon Pinal, PT,DPT 09/22/24 4:32 PM Phone: [phone] Fax: [phone]

## 2024-09-29 ENCOUNTER — Ambulatory Visit

## 2024-09-29 ENCOUNTER — Other Ambulatory Visit: Payer: Self-pay

## 2024-09-29 DIAGNOSIS — N814 Uterovaginal prolapse, unspecified: Secondary | ICD-10-CM

## 2024-09-29 DIAGNOSIS — R293 Abnormal posture: Secondary | ICD-10-CM

## 2024-09-29 DIAGNOSIS — R2689 Other abnormalities of gait and mobility: Secondary | ICD-10-CM

## 2024-09-29 DIAGNOSIS — M6281 Muscle weakness (generalized): Secondary | ICD-10-CM

## 2024-09-29 NOTE — Therapy (Signed)
 OUTPATIENT PHYSICAL THERAPY FEMALE PELVIC TREATMENT    Patient Name: Becky Lam MRN: [id number] DOB:Jan 15, 1946, 78 y.o., female Today's Date: 09/29/2024  END OF SESSION:  PT End of Session - 09/29/24 1532     Visit Number 5    Number of Visits 9    Date for Recertification  10/31/24    Authorization Type [employer]    Progress Note Due on Visit 10    PT Start Time 1534    PT Stop Time 1615    PT Time Calculation (min) 41 min    Activity Tolerance Patient tolerated treatment well    Behavior During Therapy WFL for tasks assessed/performed          Past Medical History:  Diagnosis Date   Complication of anesthesia    Dysrhythmia    fast heart beat   PONV (postoperative nausea and vomiting)    AFTER KNEE SURGERY    MAY HAVE BEEN PAIN PILL   Past Surgical History:  Procedure Laterality Date   CATARACT EXTRACTION W/PHACO Left 05/23/2016   Procedure: CATARACT EXTRACTION PHACO AND INTRAOCULAR LENS PLACEMENT (IOC);  Surgeon: Dene Etienne, MD;  Location: [facility];  Service: Ophthalmology;  Laterality: Left;  US   01:13 AP% 17.3 CDE 12.65 fluid pack lot # [id number]   CATARACT EXTRACTION W/PHACO Right 06/21/2016   Procedure: CATARACT EXTRACTION PHACO AND INTRAOCULAR LENS PLACEMENT (IOC) right eye;  Surgeon: Dene Etienne, MD;  Location: [facility];  Service: Ophthalmology;  Laterality: Right;   COLONOSCOPY  03/31/15   FINGER SURGERY Right 2003   middle    KNEE ARTHROSCOPY Left 2013   ROTATOR CUFF REPAIR Left 11/20/2006   Patient Active Problem List   Diagnosis Date Noted   B12 deficiency 11/18/2019   Iron deficiency anemia due to chronic blood loss 11/05/2018   Medicare annual wellness visit, initial 04/05/2017   Breast tenderness in female 01/20/2016   Chronic osteoarthritis 03/15/2015   Essential hypertension 03/15/2015    PCP: Dr. Oneil Pinal    REFERRING PROVIDER: Dr. Oneil Pinal  REFERRING DIAG: PF weakness and cystocele  THERAPY  DIAG:  Muscle weakness (generalized)  Cystocele with prolapse  Other abnormalities of gait and mobility  Abnormal posture  Rationale for Evaluation and Treatment: Rehabilitation  ONSET DATE: 08/19/24 referral date but started months prior   SUBJECTIVE:                                                                                                                                                                                           SUBJECTIVE STATEMENT: Pt reported she did HEP as prescribed but had a hard  time downloading peloton app, will attempt again tonight. Pt reported approx. 100% improvement in confidence re: to performing exercises correctly. Still feels bulge for cystocele during BM. She does feel it after walking 2.5-3 miles but bladder goes back up after exercises. Pt is getting up at twice a night vs. Three times a night.   EVAL:  URINARY FUNCTION: Pt reports cystocele began around 05/2024, she felt a bulge/pressure and saw her MD. She was put on estrogen cream (twice/week). Pt reported she voids approx. 4-5/day. She's also intermittent fasting per her neurologist (high risk of dementia). Pt rarely experiences urgency UI and no SUI. Pt denied pain with voiding and feels like she's emptying bladder. Pt gets up approx. Twice/night, she's on the CPAP machine. Pt feels bulging when she goes to the bathroom (cystocele) but does not have pain. BOWEL FUNCTION: pt has a BM daily, she uses a stool softener. Pt reports types 3 and 4, 75% of the time. Pt denied pain with BM or hemorrhoids. No fiber or laxatives. CORE STABILITY: Pt denied surgeries. Pt reported she had sciatic nerve pain but had injections for it and goes to the chiropractor. Pt denied MVAs or falls.  SEXUAL FUNCTION: Pt denied hx of pain with intercourse, or tampon insertion or during OBGYN. Pt denied issues with climaxing.   Fluid intake: 64 oz./day on average, sometimes mixes in electrolyte drink, or zevia  FUNCTIONAL  LIMITATIONS: none  PERTINENT HISTORY:  Medications for current condition: estrogen cream Surgeries: none Other: Hx of cystocele, OA, iron deficiency anemia, B12 deficiency, dysrhythmia, sees a chiropractor once a month, hx of TIA, sleep apnea on CPAP Sexual abuse: No   PAIN:  Are you having pain? No 09/29/24 NPRS scale: 0/10   PRECAUTIONS: None  RED FLAGS: None   WEIGHT BEARING RESTRICTIONS: No  FALLS:  Has patient fallen in last 6 months? No  OCCUPATION: retired  ACTIVITY LEVEL : goes to the gym twice a week, and walk daily, works in the yard  PLOF: Independent  PATIENT GOALS: to get the bladder back up and core stronger.    BOWEL MOVEMENT: Pain with bowel movement: No Type of bowel movement:Frequency once a day Fully empty rectum: Yes:   Leakage: No                                                     Caused by:  Pads: No Fiber supplement/laxative No but takes a stool softener  URINATION: Pain with urination: No Fully empty bladder: Yes:                                  Post-void dribble: No Stream: Strong Urgency: rarely Frequency:during the day 4-5/day                                                         Nocturia: Yes: twice/night   Leakage: Urge to void Pads/briefs: No  INTERCOURSE:  Ability to have vaginal penetration Yes  Pain with intercourse: none Dryness: No Climax: yes Marinoff Scale: 0/3   PREGNANCY: Number of pregnancies: twice Vaginal deliveries 2 Tearing  No Episiotomy No C-section deliveries 0 Currently pregnant No  PROLAPSE: Bulge   OBJECTIVE:  Note: Objective measures were completed at Evaluation unless otherwise noted.   COGNITION: Overall cognitive status: Within functional limits for tasks assessed     SENSATION: Light touch: Appears intact   FUNCTIONAL TESTS:   Single leg stance:  Rt: incr. Postural sway and required hand on wall to maintain balance  Lt: incr. Postural sway and required hand on wall to  maintain balance  GAIT: Assistive device utilized: None Comments: decr. Trunk rotation, decr. Stride length  POSTURE: forward head, decreased lumbar lordosis, and increased thoracic kyphosis has to hold wall for balance during R and L SLS.   LUMBARAROM/PROM: all WFL except for limited R rot.   A/PROM A/PROM  Eval (% available)  Flexion   Extension   Right lateral flexion   Left lateral flexion   Right rotation   Left rotation    (Blank rows = not tested)  LOWER EXTREMITY MMT:  Active MMT Right eval Left eval  Hip flexion 4 4  Hip extension    Hip abduction 3+ 3+  Hip adduction 4- 4-  /Hip internal rotation 5 5  Hip external rotation 5 5  Knee flexion 4- 4-  Knee extension 5 5  Ankle dorsiflexion 5 5  Ankle plantarflexion    Ankle inversion    Ankle eversion     (Blank rows = not tested)  LOWER EXTREMITY ROM: WNL except for B hip IR   MMT Right eval Left eval  Hip flexion    Hip extension    Hip abduction    Hip adduction    Hip internal rotation    Hip external rotation    Knee flexion    Knee extension    Ankle dorsiflexion    Ankle plantarflexion    Ankle inversion    Ankle eversion     (Blank rows = not tested) PALPATION:  General: no TTP during palpation of spine and SIJ in standing 9/29: post. Pelvic tilt, incr. B glute/hip rotator tension noted but no TTP reported. Incr. Tension in B hamstrings and R diaphragm vs. L diaphragm. No DR noted. Limited B hip IR and hypomobility of sacrum and tx spine. Difficulty determining if pt performing PFM contraction and relaxation correctly.     PELVIC MMT:   MMT eval  Vaginal   Internal Anal Sphincter   External Anal Sphincter   Puborectalis   Diastasis Recti   (Blank rows = not tested)        TONE: Incr. Tension in post. Musculature with hx of sciatica per pt.   PROLAPSE: Cystocele per MD.   CATHRINE TREATMENT:                                                                                                                               DATE: 09/29/24   THEREX: Access Code: [email or web address] URL: [email or web address] Date: 09/29/2024 Prepared by:  Delon Pinal  Exercises - Supine Angels  - 1 x daily - 7 x weekly - 1 sets - 10 reps - Sidelying Diaphragmatic Breathing  - 1 x daily - 7 x weekly - 1 sets - 5 reps - Sidelying Open Book  - 1 x daily - 7 x weekly - 1 sets - 10 reps - Bridge with Hip Abduction and Resistance - Ground Touches  - 1 x daily - 3 x weekly - 3 sets - 10 reps - Dead Bug  - 1 x daily - 3 x weekly - 3 sets - 10 reps - Bird Dog  - 1 x daily - 3 x weekly - 1 sets - 10 reps - Bear Plank from [employer]  - 1 x daily - 3 x weekly - 1 sets - 3 reps - 15-30 hold - Prone Pelvic Floor Contraction With Pillow  - 1-2 x daily - 7 x weekly - 1 sets - 10 reps Cues and demo for proper technique. S for safety. No pain reported.  SELF CARE: PATIENT EDUCATION:  Education details: PT educated pt on goal progress and reducing frequency to every other week based on progress, pt agreeable. Person educated: Patient Education method: Explanation, Demonstration, and Handouts Education comprehension: verbalized understanding, returned demonstration, and needs further education  HOME EXERCISE PROGRAM: [email or web address]  ASSESSMENT:  CLINICAL IMPRESSION: Skilled session focused on assessing STGs: all STGs met. Pt demonstrated progress as she continues to report bulge from prolapse improves with exercise and nocturia has decr. However, pt still feels bladder after BM and 2.5-3 mile walks. PT reducing frequency to every other week based on progress. The following impairments continue to be noted: limited ROM, postural dysfunction, decr. Strength likely 2/2 subjective reports and gait deviations, nocturia, urgency UI. Pt would continue to benefit from skilled PT to improve safety and decr. Pain during all ADLs.   OBJECTIVE IMPAIRMENTS: Abnormal gait, decreased balance,  decreased coordination, decreased mobility, decreased ROM, decreased strength, hypomobility, increased fascial restrictions, impaired flexibility, and postural dysfunction.   ACTIVITY LIMITATIONS: carrying, lifting, sleeping, transfers, continence, toileting, locomotion level, and caring for others  PARTICIPATION LIMITATIONS: meal prep, cleaning, laundry, community activity, and yard work  PERSONAL FACTORS: Age, Past/current experiences, and 3+ comorbidities: see above are also affecting patient's functional outcome.   REHAB POTENTIAL: Good  CLINICAL DECISION MAKING: Stable/uncomplicated  EVALUATION COMPLEXITY: Low   GOALS: Goals reviewed with patient? Yes  SHORT TERM GOALS: Target date: for all STGs: 09/29/24  Pt will be IND in HEP to improve pain, strength, coordination. Baseline: goes to gym class twice a week, walks daily; 10/20: IND Goal status: MET  2.  Finish exam and write goals as indicated. Baseline:  limited by time constraints Goal status: MET  3.  Pt will demo proper toileting posture to fully empty bladder and reduce straining during bowel movement. Baseline: unable to demo Goal status: MET  LONG TERM GOALS: Target date: for all LTGs: 10/27/24  Pt will demonstrated improved relaxation and contraction of PFM with coordination of breath to reduce urinary leakage to </=once/week. Baseline: rarely experiences urgency urinary incontinence Goal status: INITIAL  2.  Pt will demonstrate improved relaxation and contraction of pelvic floor muscles (PFM) with coordination of breath to decr. Bladder Pressure/bulge during voiding and bowel movements and not incr. cystocele. Baseline: pressure/bulge while voiding/BM Goal status: INITIAL  3.  Pt will report going to the gym for classes to maximize gains made in PT without incr. In cystocele pressure. Baseline: unsure if  she's performing exercises correctly; 10/20: Goal status: INITIAL   PLAN:  internal assessment prn,  manual therapy,progress strengthening.  PT FREQUENCY: 1x/week  PT DURATION: 8 weeks  PLANNED INTERVENTIONS: 97164- PT Re-evaluation, 97110-Therapeutic exercises, 97530- Therapeutic activity, 97112- Neuromuscular re-education, 97535- Self Care, 02859- Manual therapy, [phone]- Gait training, [phone] (1-2 muscles), 20561 (3+ muscles)- Dry Needling, Patient/Family education, Balance training, Taping, Joint mobilization, Spinal mobilization, Cryotherapy, Moist heat, and Biofeedback     Jarett Dralle L, PT 09/29/2024, 4:20 PM  Delon Pinal, PT,DPT 09/29/24 4:20 PM Phone: [phone] Fax: [phone]

## 2024-09-30 DIAGNOSIS — N814 Uterovaginal prolapse, unspecified: Secondary | ICD-10-CM | POA: Diagnosis not present

## 2024-09-30 DIAGNOSIS — N8111 Cystocele, midline: Secondary | ICD-10-CM | POA: Diagnosis not present

## 2024-10-02 DIAGNOSIS — G4733 Obstructive sleep apnea (adult) (pediatric): Secondary | ICD-10-CM | POA: Diagnosis not present

## 2024-10-02 DIAGNOSIS — R4189 Other symptoms and signs involving cognitive functions and awareness: Secondary | ICD-10-CM | POA: Diagnosis not present

## 2024-10-02 DIAGNOSIS — G939 Disorder of brain, unspecified: Secondary | ICD-10-CM | POA: Diagnosis not present

## 2024-10-02 DIAGNOSIS — E538 Deficiency of other specified B group vitamins: Secondary | ICD-10-CM | POA: Diagnosis not present

## 2024-10-03 ENCOUNTER — Other Ambulatory Visit: Payer: Self-pay | Admitting: Neurology

## 2024-10-03 DIAGNOSIS — R4189 Other symptoms and signs involving cognitive functions and awareness: Secondary | ICD-10-CM

## 2024-10-06 ENCOUNTER — Ambulatory Visit

## 2024-10-07 ENCOUNTER — Ambulatory Visit
Admission: RE | Admit: 2024-10-07 | Discharge: 2024-10-07 | Disposition: A | Discharge status: Home / Self Care | Source: Ambulatory Visit | Attending: Neurology | Admitting: Neurology

## 2024-10-07 DIAGNOSIS — M62830 Muscle spasm of back: Secondary | ICD-10-CM | POA: Diagnosis not present

## 2024-10-07 DIAGNOSIS — G459 Transient cerebral ischemic attack, unspecified: Secondary | ICD-10-CM | POA: Diagnosis not present

## 2024-10-07 DIAGNOSIS — M9901 Segmental and somatic dysfunction of cervical region: Secondary | ICD-10-CM | POA: Diagnosis not present

## 2024-10-07 DIAGNOSIS — M5136 Other intervertebral disc degeneration, lumbar region with discogenic back pain only: Secondary | ICD-10-CM | POA: Diagnosis not present

## 2024-10-07 DIAGNOSIS — R4189 Other symptoms and signs involving cognitive functions and awareness: Secondary | ICD-10-CM | POA: Insufficient documentation

## 2024-10-07 DIAGNOSIS — M9903 Segmental and somatic dysfunction of lumbar region: Secondary | ICD-10-CM | POA: Diagnosis not present

## 2024-10-13 ENCOUNTER — Other Ambulatory Visit: Payer: Self-pay

## 2024-10-13 ENCOUNTER — Ambulatory Visit: Attending: Internal Medicine

## 2024-10-13 DIAGNOSIS — R293 Abnormal posture: Secondary | ICD-10-CM | POA: Diagnosis not present

## 2024-10-13 DIAGNOSIS — N814 Uterovaginal prolapse, unspecified: Secondary | ICD-10-CM | POA: Insufficient documentation

## 2024-10-13 DIAGNOSIS — R2689 Other abnormalities of gait and mobility: Secondary | ICD-10-CM | POA: Diagnosis not present

## 2024-10-13 DIAGNOSIS — M6281 Muscle weakness (generalized): Secondary | ICD-10-CM | POA: Diagnosis not present

## 2024-10-13 NOTE — Therapy (Signed)
 OUTPATIENT PHYSICAL THERAPY FEMALE PELVIC TREATMENT    Patient Name: Becky Lam MRN: [id number] DOB:Aug 19, 1946, 78 y.o., female Today's Date: 10/13/2024  END OF SESSION:  PT End of Session - 10/13/24 1529     Visit Number 6    Number of Visits 9    Date for Recertification  10/31/24    Authorization Type [employer]    Progress Note Due on Visit 10    PT Start Time 1529    PT Stop Time 1610    PT Time Calculation (min) 41 min    Activity Tolerance Patient tolerated treatment well    Behavior During Therapy WFL for tasks assessed/performed          Past Medical History:  Diagnosis Date   Complication of anesthesia    Dysrhythmia    fast heart beat   PONV (postoperative nausea and vomiting)    AFTER KNEE SURGERY    MAY HAVE BEEN PAIN PILL   Past Surgical History:  Procedure Laterality Date   CATARACT EXTRACTION W/PHACO Left 05/23/2016   Procedure: CATARACT EXTRACTION PHACO AND INTRAOCULAR LENS PLACEMENT (IOC);  Surgeon: Dene Etienne, MD;  Location: [facility];  Service: Ophthalmology;  Laterality: Left;  US   01:13 AP% 17.3 CDE 12.65 fluid pack lot # [id number]   CATARACT EXTRACTION W/PHACO Right 06/21/2016   Procedure: CATARACT EXTRACTION PHACO AND INTRAOCULAR LENS PLACEMENT (IOC) right eye;  Surgeon: Dene Etienne, MD;  Location: [facility];  Service: Ophthalmology;  Laterality: Right;   COLONOSCOPY  03/31/15   FINGER SURGERY Right 2003   middle    KNEE ARTHROSCOPY Left 2013   ROTATOR CUFF REPAIR Left 11/20/2006   Patient Active Problem List   Diagnosis Date Noted   B12 deficiency 11/18/2019   Iron deficiency anemia due to chronic blood loss 11/05/2018   Medicare annual wellness visit, initial 04/05/2017   Breast tenderness in female 01/20/2016   Chronic osteoarthritis 03/15/2015   Essential hypertension 03/15/2015    PCP: Dr. Oneil Pinal    REFERRING PROVIDER: Dr. Oneil Pinal  REFERRING DIAG: PF weakness and cystocele  THERAPY  DIAG:  Muscle weakness (generalized)  Cystocele with prolapse  Other abnormalities of gait and mobility  Abnormal posture  Rationale for Evaluation and Treatment: Rehabilitation  ONSET DATE: 08/19/24 referral date but started months prior   SUBJECTIVE:                                                                                                                                                                                           SUBJECTIVE STATEMENT: Pt reported she went hiking again and is doing great. She  still waiting for blood work info. She had a brain MRI to check for any changes. Pt reported she'd like to switch GYNs. She went for the pessary appt and put it in but MD never came back to confirm that she put it correctly. Pt wore it successfully went she went hiking. She had exercise class and was able to hold a plank for one minute and instructor asked why she's improved and she said, because of my PT Delon.  EVAL:  URINARY FUNCTION: Pt reports cystocele began around 05/2024, she felt a bulge/pressure and saw her MD. She was put on estrogen cream (twice/week). Pt reported she voids approx. 4-5/day. She's also intermittent fasting per her neurologist (high risk of dementia). Pt rarely experiences urgency UI and no SUI. Pt denied pain with voiding and feels like she's emptying bladder. Pt gets up approx. Twice/night, she's on the CPAP machine. Pt feels bulging when she goes to the bathroom (cystocele) but does not have pain. BOWEL FUNCTION: pt has a BM daily, she uses a stool softener. Pt reports types 3 and 4, 75% of the time. Pt denied pain with BM or hemorrhoids. No fiber or laxatives. CORE STABILITY: Pt denied surgeries. Pt reported she had sciatic nerve pain but had injections for it and goes to the chiropractor. Pt denied MVAs or falls.  SEXUAL FUNCTION: Pt denied hx of pain with intercourse, or tampon insertion or during OBGYN. Pt denied issues with climaxing.   Fluid  intake: 64 oz./day on average, sometimes mixes in electrolyte drink, or zevia  FUNCTIONAL LIMITATIONS: none  PERTINENT HISTORY:  Medications for current condition: estrogen cream Surgeries: none Other: Hx of cystocele, OA, iron deficiency anemia, B12 deficiency, dysrhythmia, sees a chiropractor once a month, hx of TIA, sleep apnea on CPAP Sexual abuse: No   PAIN:  Are you having pain? No 10/13/24 NPRS scale: 0/10   PRECAUTIONS: None  RED FLAGS: None   WEIGHT BEARING RESTRICTIONS: No  FALLS:  Has patient fallen in last 6 months? No  OCCUPATION: retired  ACTIVITY LEVEL : goes to the gym twice a week, and walk daily, works in the yard  PLOF: Independent  PATIENT GOALS: to get the bladder back up and core stronger.    BOWEL MOVEMENT: Pain with bowel movement: No Type of bowel movement:Frequency once a day Fully empty rectum: Yes:   Leakage: No                                                     Caused by:  Pads: No Fiber supplement/laxative No but takes a stool softener  URINATION: Pain with urination: No Fully empty bladder: Yes:                                  Post-void dribble: No Stream: Strong Urgency: rarely Frequency:during the day 4-5/day                                                         Nocturia: Yes: twice/night   Leakage: Urge to void Pads/briefs: No  INTERCOURSE:  Ability to have vaginal  penetration Yes  Pain with intercourse: none Dryness: No Climax: yes Marinoff Scale: 0/3   PREGNANCY: Number of pregnancies: twice Vaginal deliveries 2 Tearing No Episiotomy No C-section deliveries 0 Currently pregnant No  PROLAPSE: Bulge   OBJECTIVE:  Note: Objective measures were completed at Evaluation unless otherwise noted.   COGNITION: Overall cognitive status: Within functional limits for tasks assessed     SENSATION: Light touch: Appears intact   FUNCTIONAL TESTS:   Single leg stance:  Rt: incr. Postural sway and required hand  on wall to maintain balance  Lt: incr. Postural sway and required hand on wall to maintain balance  GAIT: Assistive device utilized: None Comments: decr. Trunk rotation, decr. Stride length  POSTURE: forward head, decreased lumbar lordosis, and increased thoracic kyphosis has to hold wall for balance during R and L SLS.   LUMBARAROM/PROM: all WFL except for limited R rot.   A/PROM A/PROM  Eval (% available)  Flexion   Extension   Right lateral flexion   Left lateral flexion   Right rotation   Left rotation    (Blank rows = not tested)  LOWER EXTREMITY MMT:  Active MMT Right eval Left eval  Hip flexion 4 4  Hip extension    Hip abduction 3+ 3+  Hip adduction 4- 4-  /Hip internal rotation 5 5  Hip external rotation 5 5  Knee flexion 4- 4-  Knee extension 5 5  Ankle dorsiflexion 5 5  Ankle plantarflexion    Ankle inversion    Ankle eversion     (Blank rows = not tested)  LOWER EXTREMITY ROM: WNL except for B hip IR   MMT Right eval Left eval  Hip flexion    Hip extension    Hip abduction    Hip adduction    Hip internal rotation    Hip external rotation    Knee flexion    Knee extension    Ankle dorsiflexion    Ankle plantarflexion    Ankle inversion    Ankle eversion     (Blank rows = not tested) PALPATION:  General: no TTP during palpation of spine and SIJ in standing 9/29: post. Pelvic tilt, incr. B glute/hip rotator tension noted but no TTP reported. Incr. Tension in B hamstrings and R diaphragm vs. L diaphragm. No DR noted. Limited B hip IR and hypomobility of sacrum and tx spine. Difficulty determining if pt performing PFM contraction and relaxation correctly.     PELVIC MMT:   MMT eval  Vaginal   Internal Anal Sphincter   External Anal Sphincter   Puborectalis   Diastasis Recti   (Blank rows = not tested)        TONE: Incr. Tension in post. Musculature with hx of sciatica per pt.   PROLAPSE: Cystocele per MD.   CATHRINE TREATMENT:  DATE: 10/13/24   THEREX: Access Code: [email or web address] URL: [email or web address] Date: 10/13/2024 Prepared by: Delon Pinal  Exercises - Dead Bug  - 1 x daily - 3 x weekly - 3 sets - 10 reps with and without weights  - Prone Pelvic Floor Contraction With Pillow  - 1-2 x daily - 7 x weekly - 1 sets - 10 reps (incr. Tactile and verbal cues for technique) -Standing unilat rows and B rows. - Tall Kneeling Anti-Rotation Press With Shoulder press and RED BAND Resistance  - 1 x daily - 7 x weekly - 3 sets - 10 reps Cues and demo for proper technique. S for safety. No pain reported.  SELF CARE: PATIENT EDUCATION:  Education details: PT educated pt on progress and new progressed HEP. Discussed next visit being the last one based on excellent progress and pt agreeable. Person educated: Patient Education method: Explanation, Demonstration, and Handouts Education comprehension: verbalized understanding, returned demonstration, and needs further education  HOME EXERCISE PROGRAM: [email or web address]  ASSESSMENT:  CLINICAL IMPRESSION: Skilled session focused on progressing HEP and pt demonstrating fitness class exercises to ensure proper technique. Pt required incr. Time and mod/max cues for coordinating breath with PFM relaxation and contraction, PT utilized a gloved hand to palpate externally to ensure proper technique. Pt demonstrating such great progress, next visit will likely be final visit. The following impairments continue to be noted: limited ROM, postural dysfunction, decr. Strength likely 2/2 subjective reports and gait deviations, nocturia, urgency UI. Pt would continue to benefit from skilled PT to improve safety and decr. Pain during all ADLs.   OBJECTIVE IMPAIRMENTS: Abnormal gait, decreased balance, decreased coordination, decreased mobility, decreased  ROM, decreased strength, hypomobility, increased fascial restrictions, impaired flexibility, and postural dysfunction.   ACTIVITY LIMITATIONS: carrying, lifting, sleeping, transfers, continence, toileting, locomotion level, and caring for others  PARTICIPATION LIMITATIONS: meal prep, cleaning, laundry, community activity, and yard work  PERSONAL FACTORS: Age, Past/current experiences, and 3+ comorbidities: see above are also affecting patient's functional outcome.   REHAB POTENTIAL: Good  CLINICAL DECISION MAKING: Stable/uncomplicated  EVALUATION COMPLEXITY: Low   GOALS: Goals reviewed with patient? Yes  SHORT TERM GOALS: Target date: for all STGs: 09/29/24  Pt will be IND in HEP to improve pain, strength, coordination. Baseline: goes to gym class twice a week, walks daily; 10/20: IND Goal status: MET  2.  Finish exam and write goals as indicated. Baseline:  limited by time constraints Goal status: MET  3.  Pt will demo proper toileting posture to fully empty bladder and reduce straining during bowel movement. Baseline: unable to demo Goal status: MET  LONG TERM GOALS: Target date: for all LTGs: 10/27/24  Pt will demonstrated improved relaxation and contraction of PFM with coordination of breath to reduce urinary leakage to </=once/week. Baseline: rarely experiences urgency urinary incontinence Goal status: INITIAL  2.  Pt will demonstrate improved relaxation and contraction of pelvic floor muscles (PFM) with coordination of breath to decr. Bladder Pressure/bulge during voiding and bowel movements and not incr. cystocele. Baseline: pressure/bulge while voiding/BM Goal status: INITIAL  3.  Pt will report going to the gym for classes to maximize gains made in PT without incr. In cystocele pressure. Baseline: unsure if she's performing exercises correctly; 10/20: Goal status: INITIAL   PLAN:  check LTGs and likely d/c.  PT FREQUENCY: 1x/week  PT DURATION: 8  weeks  PLANNED INTERVENTIONS: 97164- PT Re-evaluation, 97110-Therapeutic exercises, 97530- Therapeutic activity, [id number]- Neuromuscular re-education, 97535- Self Care, 02859- Manual therapy, [id number]- Gait training, [phone] (  1-2 muscles), 20561 (3+ muscles)- Dry Needling, Patient/Family education, Balance training, Taping, Joint mobilization, Spinal mobilization, Cryotherapy, Moist heat, and Biofeedback     Tess Potts L, PT 10/13/2024, 4:01 PM  Delon Pinal, PT,DPT 10/13/24 4:01 PM Phone: [phone] Fax: [phone]

## 2024-10-16 DIAGNOSIS — G4733 Obstructive sleep apnea (adult) (pediatric): Secondary | ICD-10-CM | POA: Diagnosis not present

## 2024-10-20 ENCOUNTER — Ambulatory Visit

## 2024-10-27 ENCOUNTER — Ambulatory Visit

## 2024-10-27 ENCOUNTER — Other Ambulatory Visit: Payer: Self-pay

## 2024-10-27 DIAGNOSIS — M6281 Muscle weakness (generalized): Secondary | ICD-10-CM | POA: Diagnosis not present

## 2024-10-27 DIAGNOSIS — R293 Abnormal posture: Secondary | ICD-10-CM

## 2024-10-27 DIAGNOSIS — R2689 Other abnormalities of gait and mobility: Secondary | ICD-10-CM

## 2024-10-27 DIAGNOSIS — N814 Uterovaginal prolapse, unspecified: Secondary | ICD-10-CM

## 2024-10-27 NOTE — Therapy (Signed)
 OUTPATIENT PHYSICAL THERAPY FEMALE PELVIC TREATMENT    Patient Name: Becky Lam MRN: [id number] DOB:Jun 30, 1946, 78 y.o., female Today's Date: 10/27/2024  END OF SESSION:  PT End of Session - 10/27/24 1532     Visit Number 7    Number of Visits 9    Date for Recertification  10/31/24    Authorization Type [employer]    Progress Note Due on Visit 10    PT Start Time 1530    PT Stop Time 1555    PT Time Calculation (min) 25 min    Activity Tolerance Patient tolerated treatment well    Behavior During Therapy WFL for tasks assessed/performed          Past Medical History:  Diagnosis Date   Complication of anesthesia    Dysrhythmia    fast heart beat   PONV (postoperative nausea and vomiting)    AFTER KNEE SURGERY    MAY HAVE BEEN PAIN PILL   Past Surgical History:  Procedure Laterality Date   CATARACT EXTRACTION W/PHACO Left 05/23/2016   Procedure: CATARACT EXTRACTION PHACO AND INTRAOCULAR LENS PLACEMENT (IOC);  Surgeon: Dene Etienne, MD;  Location: [facility];  Service: Ophthalmology;  Laterality: Left;  US   01:13 AP% 17.3 CDE 12.65 fluid pack lot # [id number]   CATARACT EXTRACTION W/PHACO Right 06/21/2016   Procedure: CATARACT EXTRACTION PHACO AND INTRAOCULAR LENS PLACEMENT (IOC) right eye;  Surgeon: Dene Etienne, MD;  Location: [facility];  Service: Ophthalmology;  Laterality: Right;   COLONOSCOPY  03/31/15   FINGER SURGERY Right 2003   middle    KNEE ARTHROSCOPY Left 2013   ROTATOR CUFF REPAIR Left 11/20/2006   Patient Active Problem List   Diagnosis Date Noted   B12 deficiency 11/18/2019   Iron deficiency anemia due to chronic blood loss 11/05/2018   Medicare annual wellness visit, initial 04/05/2017   Breast tenderness in female 01/20/2016   Chronic osteoarthritis 03/15/2015   Essential hypertension 03/15/2015    PCP: Dr. Oneil Pinal    REFERRING PROVIDER: Dr. Oneil Pinal  REFERRING DIAG: PF weakness and cystocele  THERAPY  DIAG:  Muscle weakness (generalized)  Cystocele with prolapse  Other abnormalities of gait and mobility  Abnormal posture  Rationale for Evaluation and Treatment: Rehabilitation  ONSET DATE: 08/19/24 referral date but started months prior   SUBJECTIVE:                                                                                                                                                                                           SUBJECTIVE STATEMENT: Pt reported she's been feeling good, she hasn't done as much  exercises as she was volunteering this last week. She has been tired 2/2 getting up early. However, she did exercises classes. Went to [employer] again and did great on enterprise products. Pessary is going well and has a f/u tomorrow.   EVAL:  URINARY FUNCTION: Pt reports cystocele began around 05/2024, she felt a bulge/pressure and saw her MD. She was put on estrogen cream (twice/week). Pt reported she voids approx. 4-5/day. She's also intermittent fasting per her neurologist (high risk of dementia). Pt rarely experiences urgency UI and no SUI. Pt denied pain with voiding and feels like she's emptying bladder. Pt gets up approx. Twice/night, she's on the CPAP machine. Pt feels bulging when she goes to the bathroom (cystocele) but does not have pain. BOWEL FUNCTION: pt has a BM daily, she uses a stool softener. Pt reports types 3 and 4, 75% of the time. Pt denied pain with BM or hemorrhoids. No fiber or laxatives. CORE STABILITY: Pt denied surgeries. Pt reported she had sciatic nerve pain but had injections for it and goes to the chiropractor. Pt denied MVAs or falls.  SEXUAL FUNCTION: Pt denied hx of pain with intercourse, or tampon insertion or during OBGYN. Pt denied issues with climaxing.   Fluid intake: 64 oz./day on average, sometimes mixes in electrolyte drink, or zevia  FUNCTIONAL LIMITATIONS: none  PERTINENT HISTORY:  Medications for current condition: estrogen  cream Surgeries: none Other: Hx of cystocele, OA, iron deficiency anemia, B12 deficiency, dysrhythmia, sees a chiropractor once a month, hx of TIA, sleep apnea on CPAP Sexual abuse: No   PAIN:  Are you having pain? No 10/27/24 NPRS scale: 0/10   PRECAUTIONS: None  RED FLAGS: None   WEIGHT BEARING RESTRICTIONS: No  FALLS:  Has patient fallen in last 6 months? No  OCCUPATION: retired  ACTIVITY LEVEL : goes to the gym twice a week, and walk daily, works in the yard  PLOF: Independent  PATIENT GOALS: to get the bladder back up and core stronger.    BOWEL MOVEMENT: Pain with bowel movement: No Type of bowel movement:Frequency once a day Fully empty rectum: Yes:   Leakage: No                                                     Caused by:  Pads: No Fiber supplement/laxative No but takes a stool softener  URINATION: Pain with urination: No Fully empty bladder: Yes:                                  Post-void dribble: No Stream: Strong Urgency: rarely Frequency:during the day 4-5/day                                                         Nocturia: Yes: twice/night   Leakage: Urge to void Pads/briefs: No  INTERCOURSE:  Ability to have vaginal penetration Yes  Pain with intercourse: none Dryness: No Climax: yes Marinoff Scale: 0/3   PREGNANCY: Number of pregnancies: twice Vaginal deliveries 2 Tearing No Episiotomy No C-section deliveries 0 Currently pregnant No  PROLAPSE: Bulge  OBJECTIVE:  Note: Objective measures were completed at Evaluation unless otherwise noted.   COGNITION: Overall cognitive status: Within functional limits for tasks assessed     SENSATION: Light touch: Appears intact   FUNCTIONAL TESTS:   Single leg stance:  Rt: incr. Postural sway and required hand on wall to maintain balance  Lt: incr. Postural sway and required hand on wall to maintain balance  GAIT: Assistive device utilized: None Comments: decr. Trunk rotation,  decr. Stride length  POSTURE: forward head, decreased lumbar lordosis, and increased thoracic kyphosis has to hold wall for balance during R and L SLS.   LUMBARAROM/PROM: all WFL except for limited R rot.   A/PROM A/PROM  Eval (% available)  Flexion   Extension   Right lateral flexion   Left lateral flexion   Right rotation   Left rotation    (Blank rows = not tested)  LOWER EXTREMITY MMT:  Active MMT Right eval Left eval  Hip flexion 4 4  Hip extension    Hip abduction 3+ 3+  Hip adduction 4- 4-  /Hip internal rotation 5 5  Hip external rotation 5 5  Knee flexion 4- 4-  Knee extension 5 5  Ankle dorsiflexion 5 5  Ankle plantarflexion    Ankle inversion    Ankle eversion     (Blank rows = not tested)  LOWER EXTREMITY ROM: WNL except for B hip IR   MMT Right eval Left eval  Hip flexion    Hip extension    Hip abduction    Hip adduction    Hip internal rotation    Hip external rotation    Knee flexion    Knee extension    Ankle dorsiflexion    Ankle plantarflexion    Ankle inversion    Ankle eversion     (Blank rows = not tested) PALPATION:  General: no TTP during palpation of spine and SIJ in standing 9/29: post. Pelvic tilt, incr. B glute/hip rotator tension noted but no TTP reported. Incr. Tension in B hamstrings and R diaphragm vs. L diaphragm. No DR noted. Limited B hip IR and hypomobility of sacrum and tx spine. Difficulty determining if pt performing PFM contraction and relaxation correctly.     PELVIC MMT:   MMT eval  Vaginal   Internal Anal Sphincter   External Anal Sphincter   Puborectalis   Diastasis Recti   (Blank rows = not tested)        TONE: Incr. Tension in post. Musculature with hx of sciatica per pt.   PROLAPSE: Cystocele per MD.   TODAY'S TREATMENT:                                                                                                                              DATE: 10/27/24    SELF CARE: PATIENT  EDUCATION:  Education details: PT educated pt on on LTG progress and discussed HEP and how to maintain gains made  in PT. Pt asked questions about pessary cleaning and thoughts on surgery for bladder prolapse. PT also reviewed HEP with pt. Person educated: Patient Education method: Explanation, Demonstration, and Handouts Education comprehension: verbalized understanding, returned demonstration, and needs further education  HOME EXERCISE PROGRAM: [id number]  ASSESSMENT:  CLINICAL IMPRESSION: Pt met all LTGs and discharged today. Please see d/c summary.   OBJECTIVE IMPAIRMENTS: Abnormal gait, decreased balance, decreased coordination, decreased mobility, decreased ROM, decreased strength, hypomobility, increased fascial restrictions, impaired flexibility, and postural dysfunction.   ACTIVITY LIMITATIONS: carrying, lifting, sleeping, transfers, continence, toileting, locomotion level, and caring for others  PARTICIPATION LIMITATIONS: meal prep, cleaning, laundry, community activity, and yard work  PERSONAL FACTORS: Age, Past/current experiences, and 3+ comorbidities: see above are also affecting patient's functional outcome.   REHAB POTENTIAL: Good  CLINICAL DECISION MAKING: Stable/uncomplicated  EVALUATION COMPLEXITY: Low   GOALS: Goals reviewed with patient? Yes  SHORT TERM GOALS: Target date: for all STGs: 09/29/24  Pt will be IND in HEP to improve pain, strength, coordination. Baseline: goes to gym class twice a week, walks daily; 10/20: IND Goal status: MET  2.  Finish exam and write goals as indicated. Baseline:  limited by time constraints Goal status: MET  3.  Pt will demo proper toileting posture to fully empty bladder and reduce straining during bowel movement. Baseline: unable to demo Goal status: MET  LONG TERM GOALS: Target date: for all LTGs: 10/27/24  Pt will demonstrated improved relaxation and contraction of PFM with coordination of breath to  reduce urinary leakage to </=once/week. Baseline: rarely experiences urgency urinary incontinence 11/17: no leakage unless she waits too long to use bathroom Goal status: MET  2.  Pt will demonstrate improved relaxation and contraction of pelvic floor muscles (PFM) with coordination of breath to decr. Bladder Pressure/bulge during voiding and bowel movements and not incr. cystocele. Baseline: pressure/bulge while voiding/BM 11/17: able to perform PFM contraction and bladder pressure decr.  Goal status: MET  3.  Pt will report going to the gym for classes to maximize gains made in PT without incr. In cystocele pressure. Baseline: unsure if she's performing exercises correctly; 11/17: pt going to gym without issues Goal status: MET   PLAN:  d/c.  PT FREQUENCY: 1x/week  PT DURATION: 8 weeks  PLANNED INTERVENTIONS: 02835- PT Re-evaluation, 97110-Therapeutic exercises, 97530- Therapeutic activity, 97112- Neuromuscular re-education, 97535- Self Care, 02859- Manual therapy, [phone]- Gait training, [phone] (1-2 muscles), [id number] (3+ muscles)- Dry Needling, Patient/Family education, Balance training, Taping, Joint mobilization, Spinal mobilization, Cryotherapy, Moist heat, and Biofeedback   PHYSICAL THERAPY DISCHARGE SUMMARY  Visits from Start of Care: 7  Current functional level related to goals / functional outcomes: All goals met.   Remaining deficits: none   Education / Equipment: HEP and pessary placed by MD.   Patient agrees to discharge. Patient goals were met. Patient is being discharged due to meeting the stated rehab goals.   Toney Difatta L, PT 10/27/2024, 3:58 PM  Delon Pinal, PT,DPT 10/27/24 3:58 PM Phone: [phone] Fax: [phone]

## 2024-10-28 DIAGNOSIS — Z4689 Encounter for fitting and adjustment of other specified devices: Secondary | ICD-10-CM | POA: Diagnosis not present

## 2024-10-28 DIAGNOSIS — N814 Uterovaginal prolapse, unspecified: Secondary | ICD-10-CM | POA: Diagnosis not present

## 2024-10-28 DIAGNOSIS — N8111 Cystocele, midline: Secondary | ICD-10-CM | POA: Diagnosis not present

## 2024-11-03 ENCOUNTER — Ambulatory Visit

## 2024-11-04 DIAGNOSIS — H353131 Nonexudative age-related macular degeneration, bilateral, early dry stage: Secondary | ICD-10-CM | POA: Diagnosis not present

## 2024-11-04 DIAGNOSIS — Z961 Presence of intraocular lens: Secondary | ICD-10-CM | POA: Diagnosis not present

## 2024-11-04 DIAGNOSIS — G43109 Migraine with aura, not intractable, without status migrainosus: Secondary | ICD-10-CM | POA: Diagnosis not present

## 2024-11-04 DIAGNOSIS — H35373 Puckering of macula, bilateral: Secondary | ICD-10-CM | POA: Diagnosis not present

## 2024-11-10 ENCOUNTER — Ambulatory Visit

## 2024-11-15 DIAGNOSIS — G4733 Obstructive sleep apnea (adult) (pediatric): Secondary | ICD-10-CM | POA: Diagnosis not present

## 2025-01-07 ENCOUNTER — Encounter: Payer: Self-pay | Admitting: Oncology
